# Patient Record
Sex: Female | Born: 1965 | Race: White | Hispanic: No | Marital: Single | Smoking: Never smoker
Health system: Southern US, Community
[De-identification: ages and names within clinical notes are randomized; demographics above are authoritative.]

## PROBLEM LIST (undated history)

## (undated) DIAGNOSIS — D649 Anemia, unspecified: Secondary | ICD-10-CM

## (undated) DIAGNOSIS — F410 Panic disorder [episodic paroxysmal anxiety] without agoraphobia: Secondary | ICD-10-CM

## (undated) DIAGNOSIS — Z87898 Personal history of other specified conditions: Secondary | ICD-10-CM

## (undated) DIAGNOSIS — D259 Leiomyoma of uterus, unspecified: Secondary | ICD-10-CM

## (undated) DIAGNOSIS — I10 Essential (primary) hypertension: Secondary | ICD-10-CM

## (undated) DIAGNOSIS — E119 Type 2 diabetes mellitus without complications: Principal | ICD-10-CM

## (undated) HISTORY — DX: Essential (primary) hypertension: I10

## (undated) HISTORY — DX: Morbid (severe) obesity due to excess calories: E66.01

## (undated) HISTORY — DX: Type 2 diabetes mellitus without complications: E11.9

---

## 1988-05-04 HISTORY — PX: FOOT SURGERY: SHX648

## 1995-05-05 HISTORY — PX: GASTRIC BYPASS: SHX52

## 1996-05-04 HISTORY — PX: CHOLECYSTECTOMY: SHX55

## 2000-12-02 ENCOUNTER — Other Ambulatory Visit: Admission: RE | Admit: 2000-12-02 | Discharge: 2000-12-02 | Payer: Self-pay | Admitting: *Deleted

## 2014-01-12 ENCOUNTER — Ambulatory Visit (INDEPENDENT_AMBULATORY_CARE_PROVIDER_SITE_OTHER): Payer: No Typology Code available for payment source | Admitting: Family

## 2014-01-12 ENCOUNTER — Encounter: Payer: Self-pay | Admitting: Family

## 2014-01-12 VITALS — BP 160/86 | HR 85 | Temp 97.8°F | Resp 18 | Ht 65.75 in | Wt 343.4 lb

## 2014-01-12 DIAGNOSIS — I10 Essential (primary) hypertension: Secondary | ICD-10-CM

## 2014-01-12 DIAGNOSIS — R635 Abnormal weight gain: Secondary | ICD-10-CM

## 2014-01-12 LAB — BASIC METABOLIC PANEL
BUN: 11 mg/dL (ref 6–23)
CALCIUM: 8.9 mg/dL (ref 8.4–10.5)
CO2: 27 meq/L (ref 19–32)
CREATININE: 0.7 mg/dL (ref 0.4–1.2)
Chloride: 103 mEq/L (ref 96–112)
GFR: 98.06 mL/min (ref >60.00)
Glucose, Bld: 149 mg/dL — ABNORMAL HIGH (ref 70–99)
Potassium: 4.2 mEq/L (ref 3.5–5.1)
Sodium: 137 mEq/L (ref 135–145)

## 2014-01-12 LAB — TSH: TSH: 0.84 u[IU]/mL (ref 0.35–4.50)

## 2014-01-12 MED ORDER — METOPROLOL SUCCINATE ER 50 MG PO TB24: 50.0000 mg | ORAL_TABLET | Freq: Every day | ORAL | Status: DC

## 2014-01-12 NOTE — Assessment & Plan Note (Signed)
Likely worsened by weight gain. Will start toprol xl.

## 2014-01-12 NOTE — Progress Notes (Signed)
Pre visit review using our clinic review tool, if applicable. No additional management support is needed unless otherwise documented below in the visit note. 

## 2014-01-12 NOTE — Progress Notes (Signed)
Subjective:    Patient ID: Marissa Barrera, female    DOB: January 23, 1966, 48 y.o.   MRN: [id number]  HPI  Marissa Barrera is a 47 yr old female who presents today to establish care.   HTN- Pt reports that she was diagnosed with HTN around 08-03-05.  She was on medication at that time. She lost weight around 2006/08/04 down to 270's.  She is at her lifetime max weight is her current weight.  Morbid Obesity- reports in the past she has lost weight with calorie counting. She has stopped sugar and bread, processed food, refined carbs. She will eat oatmeal.  Had gastric bypass 08/03/2005, was 300 pounds and got down to 188.  Fried died in 08-03-2005, she took her friends daughter in.  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    Wt Readings from Last 3 Encounters:  01/12/14 343 lb 6.4 oz (155.765 kg)   Body mass index is 55.85 kg/(m^2).  Review of Systems  Constitutional: Positive for unexpected weight change.  HENT: Negative for hearing loss and rhinorrhea.   Eyes: Negative for visual disturbance.  Respiratory: Negative for cough and shortness of breath.   Cardiovascular: Negative for chest pain.       Occasional LE swelling with prolonged sitting  Gastrointestinal: Negative for nausea, vomiting, diarrhea and constipation.  Genitourinary: Negative for dysuria, frequency and menstrual problem.  Musculoskeletal: Negative for arthralgias and myalgias.  Skin: Negative for rash.  Neurological: Negative for headaches.  Hematological: Negative for adenopathy.  Psychiatric/Behavioral:       Reports occasional anxiety- this occurs rarely with stress       Past Medical History  Diagnosis Date  . Hypertension   . Morbid obesity     History   Social History  . Marital Status: Single    Spouse Name: N/A    Number of Children: N/A  . Years of Education: N/A   Occupational History  . Not on file.   Social History Main Topics  . Smoking status: Never Smoker   . Smokeless tobacco:  Never Used  . Alcohol Use: 1.5 oz/week    3 drink(s) per week  . Drug Use: Not on file  . Sexual Activity: Not on file   Other Topics Concern  . 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 Foot surgery Bilateral 1990    pinched nerve  . Gastric bypass  1997  . Cholecystectomy  1998    Family History  Problem Relation Age of Onset  . Arthritis Mother   . Hypertension Father   . Diabetes Father   . Parkinson's disease Father     Allergies  Allergen Reactions  . Penicillins Hives    No current outpatient prescriptions on file prior to visit.   No current facility-administered medications on file prior to visit.    BP 160/86  Pulse 85  Temp(Src) 97.8 F (36.6 C) (Oral)  Resp 18  Ht 5' 5.75" (1.67 m)  Wt 343 lb 6.4 oz (155.765 kg)  BMI 55.85 kg/m2  SpO2 99%  LMP 12/12/2013    Objective:   Physical Exam  Constitutional: She is oriented to person, place, and time. She appears well-developed and well-nourished. No distress.  HENT:  Head: Normocephalic and atraumatic.  Right Ear: Tympanic membrane and ear canal normal.  Left Ear: Tympanic membrane and ear canal normal.  Mouth/Throat: No oropharyngeal exudate, posterior oropharyngeal edema or posterior oropharyngeal erythema.  Neck: No thyromegaly present.  Cardiovascular: Normal rate and regular rhythm.   No murmur heard. Pulmonary/Chest: Effort normal and breath sounds normal. No respiratory distress. She has no wheezes. She has no rales. She exhibits no tenderness.  Musculoskeletal: She exhibits no edema.  Lymphadenopathy:    She has no cervical adenopathy.  Neurological: She is alert and oriented to person,  place, and time.  Psychiatric: She has a normal mood and affect. Her behavior is normal. Judgment and thought content normal.          Assessment & Plan:  25 minutes spent with pt today.  >50% of this time was spent counseling pt on diet, exercise, weight loss and HTN.

## 2014-01-12 NOTE — Assessment & Plan Note (Signed)
We discussed counting calories, continuing dog walking, but adding 5 additional 30 minute work outs a week of another exercise such as biking/swimming/elliptical and getting some light weight training as well. We discussed referral back to a bariatric surgeon and she declines at this time and wants to do it "on my own."

## 2014-01-12 NOTE — Patient Instructions (Addendum)
Please complete lab work prior to leaving. Start toprol xl.  Follow up in 1 month. Continue to work on [employer], exercise, weight loss. Welcome to [facility]!

## 2014-01-14 ENCOUNTER — Encounter: Payer: Self-pay | Admitting: Family

## 2014-01-14 DIAGNOSIS — R739 Hyperglycemia, unspecified: Secondary | ICD-10-CM

## 2014-01-14 HISTORY — DX: Hyperglycemia, unspecified: R73.9

## 2014-03-08 ENCOUNTER — Telehealth: Payer: Self-pay | Admitting: Family

## 2014-03-08 NOTE — Telephone Encounter (Signed)
Caller name: Callyn Relation to pt: self Call back number: [phone] Pharmacy: [facility] on [address]  Reason for call:   Patient states that her ins will not become effective until January and would like to know if Marissa Barrera would prescribe her enough blood pressure medicine(does not know name) to last her until January.

## 2014-03-08 NOTE — Telephone Encounter (Signed)
Ok to fill 

## 2014-03-08 NOTE — Telephone Encounter (Signed)
We need to repeat her BP in the office since it was so high when I saw her. She can book a nurse visit which will be cheaper for her.

## 2014-03-09 NOTE — Telephone Encounter (Signed)
Notified pt and she voices understanding. Scheduled nurse visit for 03/13/14 at 10:15am. Pt only has 6 days left of current Rx and will wait until nurse visit to see if further adjustments need to be made with medication before we send more refills.

## 2014-03-13 ENCOUNTER — Ambulatory Visit (INDEPENDENT_AMBULATORY_CARE_PROVIDER_SITE_OTHER): Payer: No Typology Code available for payment source | Admitting: *Deleted

## 2014-03-13 ENCOUNTER — Other Ambulatory Visit: Payer: Self-pay | Admitting: Family

## 2014-03-13 VITALS — BP 150/86 | HR 75

## 2014-03-13 DIAGNOSIS — I10 Essential (primary) hypertension: Secondary | ICD-10-CM

## 2014-03-13 MED ORDER — METOPROLOL SUCCINATE ER 100 MG PO TB24: 100.0000 mg | ORAL_TABLET | Freq: Every day | ORAL | Status: DC

## 2014-03-13 NOTE — Patient Instructions (Signed)
Increase in pts metoprolol rx sent .Pt to come back 2wks for bp check per @ [facility].

## 2014-03-13 NOTE — Progress Notes (Signed)
BP is improved but still above goal at today's nurse visit. Increase toprol xl from 50mg  to 100mg . Follow up in 2 weeks.

## 2014-04-11 ENCOUNTER — Ambulatory Visit (INDEPENDENT_AMBULATORY_CARE_PROVIDER_SITE_OTHER): Payer: No Typology Code available for payment source

## 2014-04-11 VITALS — BP 138/84

## 2014-04-11 DIAGNOSIS — I10 Essential (primary) hypertension: Secondary | ICD-10-CM

## 2014-04-11 MED ORDER — METOPROLOL SUCCINATE ER 100 MG PO TB24: 100.0000 mg | ORAL_TABLET | Freq: Every day | ORAL | Status: DC

## 2014-04-11 NOTE — Patient Instructions (Signed)
Continue taking same dose. We will call you if there are any changes made.

## 2014-04-11 NOTE — Progress Notes (Signed)
Pre visit review using our clinic review tool, if applicable. No additional management support is needed unless otherwise documented below in the visit note.  Patient tolerated BP check well.  Needs refill on BP med. Sent to pharmacy.

## 2014-05-10 ENCOUNTER — Other Ambulatory Visit: Payer: Self-pay | Admitting: Family

## 2014-05-10 ENCOUNTER — Telehealth: Payer: Self-pay

## 2014-05-10 NOTE — Telephone Encounter (Signed)
Last filled:  04/11/14 Amt: 30, 0 refills Last OV:  01/12/14  Med filled x 30 days.

## 2014-05-10 NOTE — Telephone Encounter (Signed)
Opened in error

## 2014-06-07 ENCOUNTER — Other Ambulatory Visit: Payer: Self-pay | Admitting: Family

## 2014-06-07 NOTE — Telephone Encounter (Signed)
Pt was unable to talk at the time of call.  Stated she had dogs that were lose and she will need to call back.

## 2014-06-08 NOTE — Telephone Encounter (Signed)
30 day supply sent to pharmacy. Pt has appt on 06/12/14 with Inda Castle, NP.

## 2014-06-12 ENCOUNTER — Ambulatory Visit: Payer: No Typology Code available for payment source | Admitting: Family

## 2014-06-19 ENCOUNTER — Ambulatory Visit: Payer: No Typology Code available for payment source | Admitting: Family

## 2014-06-26 ENCOUNTER — Ambulatory Visit (INDEPENDENT_AMBULATORY_CARE_PROVIDER_SITE_OTHER): Payer: 59 | Admitting: Family

## 2014-06-26 ENCOUNTER — Encounter: Payer: Self-pay | Admitting: Family

## 2014-06-26 VITALS — BP 128/72 | HR 73 | Temp 97.7°F | Ht 66.0 in | Wt 323.5 lb

## 2014-06-26 DIAGNOSIS — R739 Hyperglycemia, unspecified: Secondary | ICD-10-CM

## 2014-06-26 DIAGNOSIS — D6489 Other specified anemias: Secondary | ICD-10-CM

## 2014-06-26 DIAGNOSIS — E119 Type 2 diabetes mellitus without complications: Secondary | ICD-10-CM

## 2014-06-26 DIAGNOSIS — R002 Palpitations: Secondary | ICD-10-CM

## 2014-06-26 DIAGNOSIS — I10 Essential (primary) hypertension: Secondary | ICD-10-CM

## 2014-06-26 LAB — CBC WITH DIFFERENTIAL/PLATELET
BASOS ABS: 0 10*3/uL (ref 0.0–0.1)
Basophils Relative: 0.5 % (ref 0.0–3.0)
EOS ABS: 0.1 10*3/uL (ref 0.0–0.7)
Eosinophils Relative: 1.2 % (ref 0.0–5.0)
HCT: 33 % — ABNORMAL LOW (ref 36.0–46.0)
Hemoglobin: 10.8 g/dL — ABNORMAL LOW (ref 12.0–15.0)
LYMPHS ABS: 2.2 10*3/uL (ref 0.7–4.0)
Lymphocytes Relative: 26.1 % (ref 12.0–46.0)
MCHC: 32.5 g/dL (ref 30.0–36.0)
MCV: 70.8 fl — ABNORMAL LOW (ref 78.0–100.0)
MONOS PCT: 5.1 % (ref 3.0–12.0)
Monocytes Absolute: 0.4 10*3/uL (ref 0.1–1.0)
Neutro Abs: 5.6 10*3/uL (ref 1.4–7.7)
Neutrophils Relative %: 67.1 % (ref 43.0–77.0)
PLATELETS: 351 10*3/uL (ref 150.0–400.0)
RBC: 4.67 Mil/uL (ref 3.87–5.11)
RDW: 17.4 % — AB (ref 11.5–15.5)
WBC: 8.4 10*3/uL (ref 4.0–10.5)

## 2014-06-26 LAB — BASIC METABOLIC PANEL
BUN: 9 mg/dL (ref 6–23)
CALCIUM: 8.8 mg/dL (ref 8.4–10.5)
CHLORIDE: 103 meq/L (ref 96–112)
CO2: 27 mEq/L (ref 19–32)
CREATININE: 0.77 mg/dL (ref 0.40–1.20)
GFR: 84.8 mL/min (ref >60.00)
Glucose, Bld: 123 mg/dL — ABNORMAL HIGH (ref 70–99)
Potassium: 4.5 mEq/L (ref 3.5–5.1)
Sodium: 135 mEq/L (ref 135–145)

## 2014-06-26 LAB — TSH: TSH: 1.53 u[IU]/mL (ref 0.35–4.50)

## 2014-06-26 LAB — HEMOGLOBIN A1C: Hgb A1c MFr Bld: 7 % — ABNORMAL HIGH (ref 4.6–6.5)

## 2014-06-26 NOTE — Progress Notes (Signed)
Subjective:    Patient ID: Marissa Barrera, female    DOB: July 15, 1965, 49 y.o.   MRN: [id number]  HPI  Patient is currently maintained on the following medications for blood pressure: metoprolol Patient reports good compliance with blood pressure medications. Patient denies chest pain, shortness of breath, Headaches, blurred vision. Occasional LE edema with prolonged sitting which resolves with ambulation. Has had very mild associated dizziness Last 3 blood pressure readings in our office are as follows: BP Readings from Last 3 Encounters:  06/26/14 128/72  04/11/14 138/84  03/13/14 150/86   Morbid Obesity-  Wt Readings from Last 3 Encounters:  06/26/14 323 lb 8 oz (146.739 kg)  01/12/14 343 lb 6.4 oz (155.765 kg)   Notes occasional palpitations- she reports that she had an episode of palpitations last week which lasted about 10 minutes.  Usually when she has these episodes they are brief and controlled by breathing.   Review of Systems Heavy periods.    Past Medical History  Diagnosis Date  . Hypertension   . Morbid obesity     History   Social History  . Marital Status: Single    Spouse Name: N/A  . Number of Children: N/A  . Years of Education: N/A   Occupational History  . Not on file.   Social History Main Topics  . Smoking status: Never Smoker   . Smokeless tobacco: Never Used  . Alcohol Use: 1.5 oz/week    3 drink(s) per week  . Drug Use: Not on file  . Sexual Activity: Not on file   Other Topics Concern  . 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 Foot surgery Bilateral 1990    pinched nerve  . Gastric bypass  1997  . Cholecystectomy  1998     Family History  Problem Relation Age of Onset  . Arthritis Mother   . Hypertension Father   . Diabetes Father   . Parkinson's disease Father     Allergies  Allergen Reactions  . Penicillins Hives    Current Outpatient Prescriptions on File Prior to Visit  Medication Sig Dispense Refill  . metoprolol succinate (TOPROL-XL) 100 MG 24 hr tablet TAKE 1 TABLET (100 MG TOTAL) BY MOUTH DAILY. NEED OFFICE VISIT NOW 30 tablet 0   No current facility-administered medications on file prior to visit.    BP 128/72 mmHg  Pulse 73  Temp(Src) 97.7 F (36.5 C) (Oral)  Ht 5\' 6"  (1.676 m)  Wt 323 lb 8 oz (146.739 kg)  BMI 52.24 kg/m2  SpO2 98%  LMP 05/28/2014 (Approximate)       Objective:   Physical Exam  Constitutional: She is oriented to person, place, and time. She appears well-developed and well-nourished.  HENT:  Head: Normocephalic and atraumatic.  Cardiovascular: Normal rate, regular rhythm and normal heart sounds.   No murmur heard. Pulmonary/Chest: Effort normal and breath sounds normal. No respiratory distress. She has no wheezes.  Musculoskeletal: She exhibits no edema.  Neurological: She is alert and oriented to person, place, and time.  Skin: Skin is warm and dry.  Psychiatric: She  has a normal mood and affect. Her behavior is normal. Judgment and thought content normal.          Assessment & Plan:

## 2014-06-26 NOTE — Patient Instructions (Signed)
Please complete lab work prior to leaving. Call if palpitations become more frequent. Go to ER if you develop chest pain or palpitations are severe.

## 2014-06-26 NOTE — Progress Notes (Signed)
Pre visit review using our clinic review tool, if applicable. No additional management support is needed unless otherwise documented below in the visit note. 

## 2014-06-27 ENCOUNTER — Encounter: Payer: Self-pay | Admitting: Family

## 2014-06-27 ENCOUNTER — Telehealth: Payer: Self-pay | Admitting: Family

## 2014-06-27 DIAGNOSIS — D649 Anemia, unspecified: Secondary | ICD-10-CM

## 2014-06-27 DIAGNOSIS — R002 Palpitations: Secondary | ICD-10-CM | POA: Insufficient documentation

## 2014-06-27 DIAGNOSIS — E119 Type 2 diabetes mellitus without complications: Secondary | ICD-10-CM

## 2014-06-27 HISTORY — DX: Type 2 diabetes mellitus without complications: E11.9

## 2014-06-27 NOTE — Assessment & Plan Note (Addendum)
Occasional, perhaps worsened by anemia. TSH normal. Will need baseline EKG when she returns for nurse visit. Continue beta blocker.

## 2014-06-27 NOTE — Assessment & Plan Note (Signed)
New. See phone note.  Lab Results  Component Value Date   HGBA1C 7.0* 06/26/2014

## 2014-06-27 NOTE — Assessment & Plan Note (Signed)
Microcytic, suspect iron deficiency, will ask lab to add on serum iron. Add iron 325mg  bid. Obtain IFOB (see phone note)

## 2014-06-27 NOTE — Telephone Encounter (Signed)
Please contact pt and let her know that her blood work shows anemia. Please add iron 325mg  PO bid Ask lab to add on serum iron (dx anemia) Lab work also shows that she is diabetic. She needs to continue to work hard on healthy diet, avoid concentrated sweets, limit carbs, continue weight loss efforts/exercise. Please bring pt in for nurse visit.   She needs: IFOB dx (anemia) EKG (dx palpitations) Meter and CBG training please. 3 month follow up in our office.

## 2014-06-27 NOTE — Assessment & Plan Note (Signed)
Commended pt on her weight loss. Advised her to keep up weight loss efforts.

## 2014-06-27 NOTE — Assessment & Plan Note (Signed)
BP stable, continue current meds.  Consider ace next visit.  Due to new dx of DM2.

## 2014-06-28 NOTE — Telephone Encounter (Signed)
Melissa-- how often should pt check blood sugar?  Notified pt and she voices understanding. Nurse visit scheduled for 07/06/14 at 9:15am, 3 month f/u scheduled for 09/26/14 at 8:45am.

## 2014-06-28 NOTE — Telephone Encounter (Signed)
Once daily, vary times of day and record to bring to next visit.

## 2014-06-28 NOTE — Telephone Encounter (Signed)
Noted  

## 2014-06-28 NOTE — Telephone Encounter (Signed)
Ashlee-- wanted to give you a heads up regarding upcoming nurse visit (07/06/14) for the orders below.

## 2014-06-29 ENCOUNTER — Other Ambulatory Visit (INDEPENDENT_AMBULATORY_CARE_PROVIDER_SITE_OTHER): Payer: 59

## 2014-06-29 DIAGNOSIS — D649 Anemia, unspecified: Secondary | ICD-10-CM

## 2014-06-29 LAB — IRON: IRON: 44 ug/dL (ref 42–145)

## 2014-07-06 ENCOUNTER — Ambulatory Visit: Payer: 59

## 2014-07-12 ENCOUNTER — Other Ambulatory Visit: Payer: Self-pay | Admitting: Family

## 2014-08-09 ENCOUNTER — Other Ambulatory Visit: Payer: Self-pay | Admitting: Family

## 2014-09-26 ENCOUNTER — Ambulatory Visit: Payer: 59 | Admitting: Family

## 2014-10-15 ENCOUNTER — Other Ambulatory Visit: Payer: Self-pay | Admitting: Family

## 2014-10-15 NOTE — Telephone Encounter (Signed)
Metoprolol Rx sent for 30 day supply. Pt was due for follow up on 09/26/14 and is past due.  Please call pt to arrange before further refills are due.  Thank You!

## 2014-10-15 NOTE — Telephone Encounter (Signed)
Left detailed message informing patient of this. °

## 2014-11-14 ENCOUNTER — Other Ambulatory Visit: Payer: Self-pay | Admitting: Family

## 2014-11-14 NOTE — Telephone Encounter (Signed)
Pt was scheduled for follow up 09/26/14 and did not keep appt. 30 day supply already sent to pharmacy with note that appt was needed for further refills.  14 day supply sent to pharmacy. Please call pt to arrange follow up now.

## 2014-11-14 NOTE — Telephone Encounter (Signed)
Scheduled appt with pt for 12/03/14 5:00pm.

## 2014-12-02 ENCOUNTER — Other Ambulatory Visit: Payer: Self-pay | Admitting: Family

## 2014-12-03 ENCOUNTER — Ambulatory Visit: Payer: 59 | Admitting: Family

## 2014-12-03 NOTE — Telephone Encounter (Signed)
Metoprolol denial sent to pharmacy as 14 day supply was given on 11/15/14 and pt was advised that she was past due for follow up. Pt was scheduled for office visit today and cancelled with notation that she has changed Provider.

## 2016-02-10 LAB — HM MAMMOGRAPHY

## 2016-05-25 ENCOUNTER — Other Ambulatory Visit: Payer: Self-pay | Admitting: Adult Health

## 2016-05-25 MED ORDER — METOPROLOL SUCCINATE ER 100 MG PO TB24: 100.0000 mg | ORAL_TABLET | Freq: Every day | ORAL | 0 refills | Status: DC

## 2016-05-25 NOTE — Progress Notes (Signed)
Pt. Called for med refill (only had two tablets left ).  Will establish as new pt. ASAP.  Was previously seen at [place] onsite clinic.

## 2016-05-27 ENCOUNTER — Ambulatory Visit: Payer: Self-pay | Admitting: Adult Health

## 2016-06-03 ENCOUNTER — Ambulatory Visit: Payer: Self-pay | Admitting: Adult Health

## 2016-06-22 ENCOUNTER — Other Ambulatory Visit: Payer: Self-pay | Admitting: Adult Health

## 2016-06-25 ENCOUNTER — Ambulatory Visit (INDEPENDENT_AMBULATORY_CARE_PROVIDER_SITE_OTHER): Payer: BLUE CROSS/BLUE SHIELD | Admitting: Adult Health

## 2016-06-25 ENCOUNTER — Encounter: Payer: Self-pay | Admitting: Adult Health

## 2016-06-25 VITALS — BP 124/82 | HR 80 | Ht 66.0 in | Wt 350.0 lb

## 2016-06-25 DIAGNOSIS — I10 Essential (primary) hypertension: Secondary | ICD-10-CM | POA: Diagnosis not present

## 2016-06-25 DIAGNOSIS — D538 Other specified nutritional anemias: Secondary | ICD-10-CM

## 2016-06-25 DIAGNOSIS — E119 Type 2 diabetes mellitus without complications: Secondary | ICD-10-CM | POA: Diagnosis not present

## 2016-06-25 LAB — POCT GLYCOSYLATED HEMOGLOBIN (HGB A1C): HEMOGLOBIN A1C: 7

## 2016-06-25 LAB — POCT UA - MICROALBUMIN
Creatinine, POC: 200 mg/dL
Microalbumin Ur, POC: 10 mg/L

## 2016-06-25 MED ORDER — METFORMIN HCL 500 MG PO TABS: 500.0000 mg | ORAL_TABLET | Freq: Every day | ORAL | 3 refills | Status: DC

## 2016-06-25 NOTE — Assessment & Plan Note (Signed)
Follow ADA diet and resume swim routine. Addition of Metformin may help aid in weight loss.

## 2016-06-25 NOTE — Progress Notes (Signed)
Subjective:    Patient ID: Marissa Barrera, female    DOB: Feb 23, 1966, 51 y.o.   MRN: [id number]  HPI:  [name] presents to establish as new pt. She is a very pleasant 51 year old woman, with medical hx of morbid obesity, T2D, HTN, and anemia.  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  She has a strong support system of family/friends and denies acute depression today.  She has gained about 15lbs over the last 18 months and plans on re-starting am swimming at the [employer].   She states that her last PCP said her A1c was 7 and called it pre-diabetes and did not provide lifestyle modifications or start her on Metformin.   Patient Care Team    Relationship Specialty Notifications Start End  Odella Aquas, NP PCP - General Family Medicine  06/25/16     Patient Active Problem List   Diagnosis Date Noted  . Diabetes type 2, controlled ([place]) 06/27/2014  . Anemia 06/27/2014  . Palpitations 06/27/2014  . Hyperglycemia 01/14/2014  . Morbid obesity ([place]) 01/12/2014  . HTN (hypertension) 01/12/2014     Past Medical History:  Diagnosis Date  . Diabetes type 2, controlled ([place]) 06/27/2014  . Hypertension   . Morbid obesity ([place])      Past Surgical History:  Procedure Laterality Date  . CHOLECYSTECTOMY  1998  . FOOT SURGERY Bilateral 1990   pinched nerve  . GASTRIC BYPASS  1997     Family History  Problem Relation Age of Onset  . Arthritis Mother   . Hypertension Father   . Diabetes Father   . Parkinson's disease Father   . Cancer Maternal Aunt     breast, ovarian, colon  . Cancer Maternal Grandmother     ovarian  . Heart attack Maternal Grandfather   . Parkinson's disease Paternal Grandmother   . Heart attack Paternal Grandfather      History  Drug Use No     History  Alcohol Use  . 1.5 oz/week  . 3 Standard drinks or equivalent per week    Comment: varied     History  Smoking Status  . Never Smoker   Smokeless Tobacco  . Never Used     Outpatient Encounter Prescriptions as of 06/25/2016  Medication Sig  . losartan-hydrochlorothiazide (HYZAAR) 50-12.5 MG tablet Take 1 tablet by mouth daily.  . metoprolol succinate (TOPROL-XL) 100 MG 24 hr tablet Take 1 tablet (100 mg total) by mouth daily. Take with or immediately following a meal.  . metFORMIN (GLUCOPHAGE) 500 MG tablet Take 1 tablet (500 mg total) by mouth daily after supper.  . [DISCONTINUED] ferrous sulfate 325 (65 FE) MG tablet Take 325 mg by mouth 2 (two) times daily with a meal.   No facility-administered encounter medications on file as of 06/25/2016.     Allergies: Ace inhibitors and Penicillins  Body mass index is 56.49 kg/m.  Blood pressure 124/82, pulse 80, height 5\' 6"  (1.676 m), weight (!) 350 lb (158.8 kg), last menstrual period 06/08/2016.     Review of Systems  Constitutional: Negative for activity change, appetite change, chills, diaphoresis, fatigue, fever and unexpected weight change.  HENT: Negative for congestion.   Eyes: Negative for visual disturbance.  Respiratory: Negative for cough and shortness of breath.   Cardiovascular: Negative for chest pain, palpitations and leg swelling.  Gastrointestinal: Negative for abdominal distention, abdominal pain, blood in stool, constipation, diarrhea,  nausea and vomiting.  Endocrine: Negative for cold intolerance, heat intolerance, polydipsia, polyphagia and polyuria.  Genitourinary: Negative for difficulty urinating and flank pain.  Musculoskeletal: Negative for arthralgias, back pain, gait problem, joint swelling and myalgias.  Neurological: Negative for dizziness, tremors, weakness, light-headedness and headaches.  Psychiatric/Behavioral: Negative for agitation, behavioral problems, confusion, decreased concentration, hallucinations, self-injury, sleep disturbance and suicidal ideas. The patient is not nervous/anxious and is not hyperactive.         Objective:   Physical Exam  Constitutional: She is oriented to person, place, and time. She appears well-developed and well-nourished. No distress.  HENT:  Head: Normocephalic and atraumatic.  Right Ear: External ear normal.  Left Ear: External ear normal.  Eyes: Conjunctivae and EOM are normal. Pupils are equal, round, and reactive to light.  Neck: Normal range of motion. Neck supple.  Cardiovascular: Normal rate, regular rhythm, normal heart sounds and intact distal pulses.   Pulmonary/Chest: Effort normal and breath sounds normal. No respiratory distress. She has no wheezes. She has no rales. She exhibits no tenderness.  Abdominal: Soft. Bowel sounds are normal. She exhibits no distension and no mass. There is no tenderness. There is no rebound and no guarding.  Protuberant abdomin.  Musculoskeletal: Normal range of motion.  Lymphadenopathy:    She has no cervical adenopathy.  Neurological: She is alert and oriented to person, place, and time. She has normal reflexes.  Skin: Skin is warm and dry. No rash noted. She is not diaphoretic. No erythema. No pallor.  Psychiatric: She has a normal mood and affect. Her behavior is normal. Judgment and thought content normal.  Nursing note and vitals reviewed.         Assessment & Plan:   1. Diabetes mellitus without complication ([place])   2. Essential hypertension   3. Controlled type 2 diabetes mellitus without complication, without long-term current use of insulin ([place])   4. Morbid obesity ([place])   5. Other specified nutritional anemias     HTN (hypertension) Take Losartan/HCTZ in am. Continue daily Metoprolol. Increase regular movement (i.e. Morning swimming at [facility]). Reduce sodium intake.  Diabetes type 2, controlled Last A1c was 7 06/2014, however PCP at time never explained that she was diabetic. Today's A1c is 7-discussed in detail ADA guidelines and that she in fact diabetic. Started on Metformin 500mg  nightly with  dinner. Diabetes testing, eating, and exercise information provided. Once insurance guidelines have been verified, will send in testing supplies. Please return in 90 days for A1c re-check and regular f/u.  Morbid obesity Follow ADA diet and resume swim routine. Addition of Metformin may help aid in weight loss.    FOLLOW-UP:  Return in about 3 months (around 09/22/2016).

## 2016-06-25 NOTE — Patient Instructions (Addendum)
Carbohydrate Counting for Diabetes Mellitus, Adult Carbohydrate counting is a method for keeping track of how many carbohydrates you eat. Eating carbohydrates naturally increases the amount of sugar (glucose) in the blood. Counting how many carbohydrates you eat helps keep your blood glucose within normal limits, which helps you manage your diabetes (diabetes mellitus). It is important to know how many carbohydrates you can safely have in each meal. This is different for every person. A diet and nutrition specialist (registered dietitian) can help you make a meal plan and calculate how many carbohydrates you should have at each meal and snack. Carbohydrates are found in the following foods:  Grains, such as breads and cereals.  Dried beans and soy products.  Starchy vegetables, such as potatoes, peas, and corn.  Fruit and fruit juices.  Milk and yogurt.  Sweets and snack foods, such as cake, cookies, candy, chips, and soft drinks. How do I count carbohydrates? There are two ways to count carbohydrates in food. You can use either of the methods or a combination of both. Reading "Nutrition Facts" on packaged food  The "Nutrition Facts" list is included on the labels of almost all packaged foods and beverages in the [place] It includes:  The serving size.  Information about nutrients in each serving, including the grams (g) of carbohydrate per serving. To use the "Nutrition Facts":  Decide how many servings you will have.  Multiply the number of servings by the number of carbohydrates per serving.  The resulting number is the total amount of carbohydrates that you will be having. Learning standard serving sizes of other foods  When you eat foods containing carbohydrates that are not packaged or do not include "Nutrition Facts" on the label, you need to measure the servings in order to count the amount of carbohydrates:  Measure the foods that you will eat with a food scale or measuring  cup, if needed.  Decide how many standard-size servings you will eat.  Multiply the number of servings by 15. Most carbohydrate-rich foods have about 15 g of carbohydrates per serving.  For example, if you eat 8 oz (170 g) of strawberries, you will have eaten 2 servings and 30 g of carbohydrates (2 servings x 15 g = 30 g).  For foods that have more than one food mixed, such as soups and casseroles, you must count the carbohydrates in each food that is included. The following list contains standard serving sizes of common carbohydrate-rich foods. Each of these servings has about 15 g of carbohydrates:   hamburger bun or  English muffin.   oz (15 mL) syrup.   oz (14 g) jelly.  1 slice of bread.  1 six-inch tortilla.  3 oz (85 g) cooked rice or pasta.  4 oz (113 g) cooked dried beans.  4 oz (113 g) starchy vegetable, such as peas, corn, or potatoes.  4 oz (113 g) hot cereal.  4 oz (113 g) mashed potatoes or  of a large baked potato.  4 oz (113 g) canned or frozen fruit.  4 oz (120 mL) fruit juice.  4-6 crackers.  6 chicken nuggets.  6 oz (170 g) unsweetened dry cereal.  6 oz (170 g) plain fat-free yogurt or yogurt sweetened with artificial sweeteners.  8 oz (240 mL) milk.  8 oz (170 g) fresh fruit or one small piece of fruit.  24 oz (680 g) popped popcorn. Example of carbohydrate counting Sample meal  3 oz (85 g) chicken breast.  6 oz (  170 g) brown rice.  4 oz (113 g) corn.  8 oz (240 mL) milk.  8 oz (170 g) strawberries with sugar-free whipped topping. Carbohydrate calculation 1. Identify the foods that contain carbohydrates:  Rice.  Corn.  Milk.  Strawberries. 2. Calculate how many servings you have of each food:  2 servings rice.  1 serving corn.  1 serving milk.  1 serving strawberries. 3. Multiply each number of servings by 15 g:  2 servings rice x 15 g = 30 g.  1 serving corn x 15 g = 15 g.  1 serving milk x 15 g = 15  g.  1 serving strawberries x 15 g = 15 g. 4. Add together all of the amounts to find the total grams of carbohydrates eaten:  30 g + 15 g + 15 g + 15 g = 75 g of carbohydrates total. This information is not intended to replace advice given to you by your health care provider. Make sure you discuss any questions you have with your health care provider. Document Released: 04/20/2005 Document Revised: 11/08/2015 Document Reviewed: 10/02/2015 Elsevier Interactive Patient Education  2017 [place]. Diabetes Mellitus and Exercise Exercising regularly is important for your overall health, especially when you have diabetes (diabetes mellitus). Exercising is not only about losing weight. It has many health benefits, such as increasing muscle strength and bone density and reducing body fat and stress. This leads to improved fitness, flexibility, and endurance, all of which result in better overall health. Exercise has additional benefits for people with diabetes, including:  Reducing appetite.  Helping to lower and control blood glucose.  Lowering blood pressure.  Helping to control amounts of fatty substances (lipids) in the blood, such as cholesterol and triglycerides.  Helping the body to respond better to insulin (improving insulin sensitivity).  Reducing how much insulin the body needs.  Decreasing the risk for heart disease by:  Lowering cholesterol and triglyceride levels.  Increasing the levels of good cholesterol.  Lowering blood glucose levels. What is my activity plan? Your health care provider or certified diabetes educator can help you make a plan for the type and frequency of exercise (activity plan) that works for you. Make sure that you:  Do at least 150 minutes of moderate-intensity or vigorous-intensity exercise each week. This could be brisk walking, biking, or water aerobics.  Do stretching and strength exercises, such as yoga or weightlifting, at least 2 times a  week.  Spread out your activity over at least 3 days of the week.  Get some form of physical activity every day.  Do not go more than 2 days in a row without some kind of physical activity.  Avoid being inactive for more than 90 minutes at a time. Take frequent breaks to walk or stretch.  Choose a type of exercise or activity that you enjoy, and set realistic goals.  Start slowly, and gradually increase the intensity of your exercise over time. What do I need to know about managing my diabetes?  Check your blood glucose before and after exercising.  If your blood glucose is higher than 240 mg/dL (13.3 mmol/L) before you exercise, check your urine for ketones. If you have ketones in your urine, do not exercise until your blood glucose returns to normal.  Know the symptoms of low blood glucose (hypoglycemia) and how to treat it. Your risk for hypoglycemia increases during and after exercise. Common symptoms of hypoglycemia can include:  Hunger.  Anxiety.  Sweating and  feeling clammy.  Confusion.  Dizziness or feeling light-headed.  Increased heart rate or palpitations.  Blurry vision.  Tingling or numbness around the mouth, lips, or tongue.  Tremors or shakes.  Irritability.  Keep a rapid-acting carbohydrate snack available before, during, and after exercise to help prevent or treat hypoglycemia.  Avoid injecting insulin into areas of the body that are going to be exercised. For example, avoid injecting insulin into:  The arms, when playing tennis.  The legs, when jogging.  Keep records of your exercise habits. Doing this can help you and your health care provider adjust your diabetes management plan as needed. Write down:  Food that you eat before and after you exercise.  Blood glucose levels before and after you exercise.  The type and amount of exercise you have done.  When your insulin is expected to peak, if you use insulin. Avoid exercising at times when  your insulin is peaking.  When you start a new exercise or activity, work with your health care provider to make sure the activity is safe for you, and to adjust your insulin, medicines, or food intake as needed.  Drink plenty of water while you exercise to prevent dehydration or heat stroke. Drink enough fluid to keep your urine clear or pale yellow. This information is not intended to replace advice given to you by your health care provider. Make sure you discuss any questions you have with your health care provider. Document Released: 07/11/2003 Document Revised: 11/08/2015 Document Reviewed: 09/30/2015 Elsevier Interactive Patient Education  2017 [place]. Blood Glucose Monitoring, Adult Monitoring your blood sugar (glucose) helps you manage your diabetes. It also helps you and your health care provider determine how well your diabetes management plan is working. Blood glucose monitoring involves checking your blood glucose as often as directed, and keeping a record (log) of your results over time. Why should I monitor my blood glucose? Checking your blood glucose regularly can:  Help you understand how food, exercise, illnesses, and medicines affect your blood glucose.  Let you know what your blood glucose is at any time. You can quickly tell if you are having low blood glucose (hypoglycemia) or high blood glucose (hyperglycemia).  Help you and your health care provider adjust your medicines as needed. When should I check my blood glucose? Follow instructions from your health care provider about how often to check your blood glucose. This may depend on:  The type of diabetes you have.  How well-controlled your diabetes is.  Medicines you are taking. If you have type 1 diabetes:  Check your blood glucose at least 2 times a day.  Also check your blood glucose:  Before every insulin injection.  Before and after exercise.  Between meals.  2 hours after a  meal.  Occasionally between 2:00 a.m. and 3:00 a.m., as directed.  Before potentially dangerous tasks, like driving or using heavy machinery.  At bedtime.  You may need to check your blood glucose more often, up to 6-10 times a day:  If you use an insulin pump.  If you need multiple daily injections (MDI).  If your diabetes is not well-controlled.  If you are ill.  If you have a history of severe hypoglycemia.  If you have a history of not knowing when your blood glucose is getting low (hypoglycemia unawareness). If you have type 2 diabetes:  If you take insulin or other diabetes medicines, check your blood glucose at least 2 times a day.  If you  are on intensive insulin therapy, check your blood glucose at least 4 times a day. Occasionally, you may also need to check between 2:00 a.m. and 3:00 a.m., as directed.  Also check your blood glucose:  Before and after exercise.  Before potentially dangerous tasks, like driving or using heavy machinery.  You may need to check your blood glucose more often if:  Your medicine is being adjusted.  Your diabetes is not well-controlled.  You are ill. What is a blood glucose log?  A blood glucose log is a record of your blood glucose readings. It helps you and your health care provider:  Look for patterns in your blood glucose over time.  Adjust your diabetes management plan as needed.  Every time you check your blood glucose, write down your result and notes about things that may be affecting your blood glucose, such as your diet and exercise for the day.  Most glucose meters store a record of glucose readings in the meter. Some meters allow you to download your records to a computer. How do I check my blood glucose? Follow these steps to get accurate readings of your blood glucose: Supplies needed   Blood glucose meter.  Test strips for your meter. Each meter has its own strips. You must use the strips that come with your  meter.  A needle to prick your finger (lancet). Do not use lancets more than once.  A device that holds the lancet (lancing device).  A journal or log book to write down your results. Procedure  Wash your hands with soap and water.  Prick the side of your finger (not the tip) with the lancet. Use a different finger each time.  Gently rub the finger until a small drop of blood appears.  Follow instructions that come with your meter for inserting the test strip, applying blood to the strip, and using your blood glucose meter.  Write down your result and any notes. Alternative testing sites  Some meters allow you to use areas of your body other than your finger (alternative sites) to test your blood.  If you think you may have hypoglycemia, or if you have hypoglycemia unawareness, do not use alternative sites. Use your finger instead.  Alternative sites may not be as accurate as the fingers, because blood flow is slower in these areas. This means that the result you get may be delayed, and it may be different from the result that you would get from your finger.  The most common alternative sites are:  Forearm.  Thigh.  Palm of the hand. Additional tips  Always keep your supplies with you.  If you have questions or need help, all blood glucose meters have a 24-hour "hotline" number that you can call. You may also contact your health care provider.  After you use a few boxes of test strips, adjust (calibrate) your blood glucose meter by following instructions that came with your meter. This information is not intended to replace advice given to you by your health care provider. Make sure you discuss any questions you have with your health care provider. Document Released: 04/23/2003 Document Revised: 11/08/2015 Document Reviewed: 09/30/2015 Elsevier Interactive Patient Education  2017 [place] blood pressure medications. Recommend taking Losartan/HCTZ in  am. Please return in 3 months for A1c re-check and regular f/u. Please bring blood sugar log with you to your next appt.

## 2016-06-25 NOTE — Assessment & Plan Note (Signed)
Take Losartan/HCTZ in am. Continue daily Metoprolol. Increase regular movement (i.e. Morning swimming at [facility]). Reduce sodium intake.

## 2016-06-25 NOTE — Assessment & Plan Note (Signed)
Last A1c was 7 06/2014, however PCP at time never explained that she was diabetic. Today's A1c is 7-discussed in detail ADA guidelines and that she in fact diabetic. Started on Metformin 500mg  nightly with dinner. Diabetes testing, eating, and exercise information provided. Once insurance guidelines have been verified, will send in testing supplies. Please return in 90 days for A1c re-check and regular f/u.

## 2016-06-26 ENCOUNTER — Other Ambulatory Visit: Payer: Self-pay

## 2016-06-26 MED ORDER — METOPROLOL SUCCINATE ER 100 MG PO TB24: ORAL_TABLET | ORAL | 0 refills | Status: DC

## 2016-06-26 MED ORDER — LOSARTAN POTASSIUM-HCTZ 50-12.5 MG PO TABS: 1.0000 | ORAL_TABLET | Freq: Every day | ORAL | 0 refills | Status: DC

## 2016-06-29 ENCOUNTER — Telehealth: Payer: Self-pay | Admitting: Adult Health

## 2016-06-29 MED ORDER — METOPROLOL SUCCINATE ER 100 MG PO TB24: ORAL_TABLET | ORAL | 0 refills | Status: DC

## 2016-06-29 NOTE — Telephone Encounter (Signed)
Patient is aware that we sent her metoprolol to [facility] but she was just informed that she will not receive it for 2 weeks and is currently out. She was wondering if she could get a 2 week supply sent to the [facility] [address] to get her through until the mail order one arrives.

## 2016-06-29 NOTE — Addendum Note (Signed)
Addended by: Fonnie Mu on: 06/29/2016 11:24 AM   Modules accepted: Orders

## 2016-06-29 NOTE — Telephone Encounter (Signed)
LVM for pt informing her 2 week RX sent to [facility].  Charyl Bigger, CMA

## 2016-07-01 ENCOUNTER — Telehealth: Payer: Self-pay

## 2016-07-01 NOTE — Telephone Encounter (Signed)
Received fax from [facility] requesting refill on Metformin.  Faxed returned informing them that RX was sent to [facility] on 06/25/16 and they may contact this pharmacy to have RX transferred.  Charyl Bigger, CMA

## 2016-09-01 ENCOUNTER — Other Ambulatory Visit: Payer: Self-pay | Admitting: Adult Health

## 2016-09-02 ENCOUNTER — Other Ambulatory Visit: Payer: Self-pay

## 2016-09-02 MED ORDER — METFORMIN HCL 500 MG PO TABS: 500.0000 mg | ORAL_TABLET | Freq: Every day | ORAL | 3 refills | Status: DC

## 2016-11-12 ENCOUNTER — Other Ambulatory Visit: Payer: Self-pay | Admitting: Adult Health

## 2016-11-25 ENCOUNTER — Other Ambulatory Visit: Payer: Self-pay | Admitting: Adult Health

## 2016-12-22 ENCOUNTER — Other Ambulatory Visit: Payer: Self-pay | Admitting: Adult Health

## 2017-01-06 ENCOUNTER — Ambulatory Visit: Payer: BLUE CROSS/BLUE SHIELD | Admitting: Adult Health

## 2017-01-20 ENCOUNTER — Ambulatory Visit: Payer: BLUE CROSS/BLUE SHIELD | Admitting: Adult Health

## 2017-10-15 ENCOUNTER — Other Ambulatory Visit: Payer: Self-pay | Admitting: Adult Health

## 2017-10-20 ENCOUNTER — Telehealth: Payer: Self-pay | Admitting: General Practice

## 2017-10-20 NOTE — Telephone Encounter (Signed)
Called patient to set up 30 Appt for Rx refill required OV--per patient [facility] is out of network with her Ins Co --she can no longer come her--FYI--glh

## 2017-10-20 NOTE — Telephone Encounter (Signed)
Noted MPulliam, CMA/RT(R)  

## 2017-11-11 DIAGNOSIS — E538 Deficiency of other specified B group vitamins: Secondary | ICD-10-CM | POA: Insufficient documentation

## 2018-02-10 DIAGNOSIS — M79671 Pain in right foot: Secondary | ICD-10-CM | POA: Insufficient documentation

## 2020-02-05 DIAGNOSIS — N950 Postmenopausal bleeding: Secondary | ICD-10-CM | POA: Insufficient documentation

## 2020-02-14 DIAGNOSIS — IMO0001 Reserved for inherently not codable concepts without codable children: Secondary | ICD-10-CM | POA: Insufficient documentation

## 2020-02-14 DIAGNOSIS — Z531 Procedure and treatment not carried out because of patient's decision for reasons of belief and group pressure: Secondary | ICD-10-CM | POA: Insufficient documentation

## 2020-03-04 HISTORY — PX: HYSTEROSCOPY: SHX211

## 2020-06-17 ENCOUNTER — Encounter: Payer: Self-pay | Admitting: Obstetrics & Gynecology

## 2020-06-17 ENCOUNTER — Ambulatory Visit: Payer: 59 | Admitting: Obstetrics & Gynecology

## 2020-06-17 ENCOUNTER — Other Ambulatory Visit: Payer: Self-pay

## 2020-06-17 VITALS — BP 122/77 | HR 75 | Wt 329.0 lb

## 2020-06-17 DIAGNOSIS — N939 Abnormal uterine and vaginal bleeding, unspecified: Secondary | ICD-10-CM | POA: Diagnosis not present

## 2020-06-17 DIAGNOSIS — Z30431 Encounter for routine checking of intrauterine contraceptive device: Secondary | ICD-10-CM | POA: Diagnosis not present

## 2020-06-17 DIAGNOSIS — N950 Postmenopausal bleeding: Secondary | ICD-10-CM | POA: Diagnosis not present

## 2020-06-17 DIAGNOSIS — D250 Submucous leiomyoma of uterus: Secondary | ICD-10-CM | POA: Diagnosis not present

## 2020-06-17 DIAGNOSIS — N840 Polyp of corpus uteri: Secondary | ICD-10-CM

## 2020-06-17 NOTE — Progress Notes (Signed)
History:  55 y.o. G0P0000 here today for eval of PMPB. Pt was being seen at [facility]. She has changed care due to insurance changes. She had a hysteroscopy with D&C and polypectomy 03/15/2020 due to perimenopausal bleeding. The op note revealed that  Polyps were removed. The surg path shows benign pathology.  Pt had a LnIUD placed at the same time. Pt reports that she has continued to bleed since that time. Over the past 2 weeks the bleeding has decreased to spotting and over the last 1-2 days it has finally stopped. This visit would have been her followup with her prev provider but, she was also concerned about the continuous bleeding.   The following portions of the patient's history were reviewed and updated as appropriate: allergies, current medications, past family history, past medical history, past social history, past surgical history and problem list.  Review of Systems:  Pertinent items are noted in HPI.    Objective:  Physical Exam Blood pressure 122/77, pulse 75, weight (!) 329 lb (149.2 kg).  CONSTITUTIONAL: Well-developed, well-nourished female in no acute distress.  HENT:  Normocephalic, atraumatic EYES: Conjunctivae and EOM are normal. No scleral icterus.  NECK: Normal range of motion SKIN: Skin is warm and dry. No rash noted. Not diaphoretic.No pallor. [place]: Alert and oriented to person, place, and time. Normal coordination.  Pelvic: pt declined  Labs and Imaging surg path from [facility]: 03/15/2020 ENDOMETRIUM, CURETTAGE: Fragments of benign endometrial polyp. Few fragments of weakly proliferative endometrium. No malignancy identified.  Assessment & Plan:  Post menopausal bleeding- review of the records is included above. I suspect the bleeding is still related to the endometrial thickness that is not becoming atrophic. The path was neg for malignancy.  Rec 3 months f/u Pt instructed to call if her bleeding returns and is heavy.  Will follow for now.    Total face-to-face time with patient was 30 min.  Greater than 50% was spent in counseling and coordination of care with the patient.   Dawne Casali L. Harraway-Smith, M.D., Cherlynn June

## 2020-07-23 ENCOUNTER — Ambulatory Visit: Payer: 59 | Admitting: Legal Medicine

## 2020-08-29 ENCOUNTER — Encounter: Payer: Self-pay | Admitting: Nurse Practitioner

## 2020-08-29 ENCOUNTER — Ambulatory Visit (INDEPENDENT_AMBULATORY_CARE_PROVIDER_SITE_OTHER): Payer: 59 | Admitting: Nurse Practitioner

## 2020-08-29 ENCOUNTER — Other Ambulatory Visit: Payer: Self-pay

## 2020-08-29 VITALS — BP 146/90 | HR 74 | Temp 97.8°F | Ht 66.0 in | Wt 329.8 lb

## 2020-08-29 DIAGNOSIS — Z13220 Encounter for screening for lipoid disorders: Secondary | ICD-10-CM

## 2020-08-29 DIAGNOSIS — I10 Essential (primary) hypertension: Secondary | ICD-10-CM

## 2020-08-29 DIAGNOSIS — E538 Deficiency of other specified B group vitamins: Secondary | ICD-10-CM

## 2020-08-29 DIAGNOSIS — E1165 Type 2 diabetes mellitus with hyperglycemia: Secondary | ICD-10-CM

## 2020-08-29 DIAGNOSIS — E782 Mixed hyperlipidemia: Secondary | ICD-10-CM | POA: Diagnosis not present

## 2020-08-29 MED ORDER — LOSARTAN POTASSIUM-HCTZ 50-12.5 MG PO TABS: 1.0000 | ORAL_TABLET | Freq: Every day | ORAL | 1 refills | Status: DC

## 2020-08-29 MED ORDER — METOPROLOL SUCCINATE ER 200 MG PO TB24
200.0000 mg | ORAL_TABLET | Freq: Every day | ORAL | 3 refills | Status: DC
Start: 2020-08-29 — End: 2021-03-03

## 2020-08-29 NOTE — Progress Notes (Signed)
Subjective:  Patient ID: Marissa Barrera, female    DOB: 1966-04-17  Age: 55 y.o. MRN: [id number]  CC: Establish Care (New patient/Would like to discuss DM medications, would like to discuss the benefits of ozempic. )  HPI  Controlled type 2 diabetes mellitus without complication, without long-term current use of insulin ([place]) Last HgbA1c of 6.3 per [facility] provider 04/2020 Current use of Ozempic 0.5mg  weekly x approx.38months, in combination with metformin 500mg  daily. Does not check glucose at home No neuropathy, no CKD  Urine microalbumin: normal CMP: normal HgbA1c: 6.6% Elevated LDL: start atorvastatin 20mg . New rx sent Continue metformin and ozempic F/up in 64months   Morbid obesity (Marissa Barrera) Reports 40lbs weight loss in last 1year with use of ozempic. Has difficulty maintaining recommended diet due to increased anxiety/depression. No exercise regimen. She had nutrition consult in past and declines another referral to this time. Due to emotional eating advised about need for counseling, but she declined at this time.  Advised to incorporate daily exercise and maintain low fat/low carb diet. F/up in 42months  Essential hypertension BP is slightly elevated today asymmptomatic Current use of metoprolol and losartan/HCTZ BP Readings from Last 3 Encounters:  08/29/20 (!) 146/90  06/17/20 122/77  06/25/16 124/82   Advise to monitor at home Repeat CMP. Maintain current medication F/up in 66month  Vitamin B12 deficiency Normal repeat B12  Wt Readings from Last 3 Encounters:  08/29/20 (!) 329 lb 12.8 oz (149.6 kg)  06/17/20 (!) 329 lb (149.2 kg)  06/25/16 (!) 350 lb (158.8 kg)   BP Readings from Last 3 Encounters:  08/29/20 (!) 146/90  06/17/20 122/77  06/25/16 124/82   Depression screen PHQ 2/9 08/29/2020  Decreased Interest 1  Down, Depressed, Hopeless 1  PHQ - 2 Score 2  Altered sleeping 2  Tired, decreased energy 1  Change in appetite 2  Feeling bad or failure  about yourself  3  Trouble concentrating 0  Moving slowly or fidgety/restless 0  Suicidal thoughts 0  PHQ-9 Score 10  Difficult doing work/chores Somewhat difficult   GAD 7 : Generalized Anxiety Score 08/29/2020  Nervous, Anxious, on Edge 1  Control/stop worrying 1  Worry too much - different things 1  Trouble relaxing 0  Restless 0  Easily annoyed or irritable 1  Afraid - awful might happen 1  Total GAD 7 Score 5  Anxiety Difficulty Not difficult at all   Reviewed past Medical, Social and Family history today.  Outpatient Medications Prior to Visit  Medication Sig Dispense Refill  . metFORMIN (GLUCOPHAGE) 500 MG tablet Take 1 tablet by mouth daily after supper.  Patient needs office visit for further refills. MPulliam, CMA/RT(R) 30 tablet 0  . metoprolol succinate (TOPROL-XL) 100 MG 24 hr tablet Take with or immediately following a meal. (Patient taking differently: 200 mg. Take with or immediately following a meal.) 15 tablet 0  . losartan-hydrochlorothiazide (HYZAAR) 50-12.5 MG tablet TAKE 1 TABLET BY MOUTH  DAILY (Patient not taking: No sig reported) 30 tablet 0  . metoprolol succinate (TOPROL-XL) 100 MG 24 hr tablet TAKE 1 TABLET BY MOUTH  DAILY WITH OR IMMEDIATELY  FOLLOWING A MEAL. (Patient not taking: No sig reported) 90 tablet 0  . metoprolol succinate (TOPROL-XL) 100 MG 24 hr tablet TAKE 1 TABLET BY MOUTH  DAILY WITH OR IMMEDIATELY  FOLLOWING A MEAL. (Patient not taking: No sig reported) 30 tablet 0  . Semaglutide (OZEMPIC, 0.25 OR 0.5 MG/DOSE, [place]) Inject into the skin. (Patient not taking:  Reported on 08/29/2020)     No facility-administered medications prior to visit.    ROS See HPI  Objective:  BP (!) 146/90 (BP Location: Left Arm, Patient Position: Sitting, Cuff Size: Large)   Pulse 74   Temp 97.8 F (36.6 C) (Temporal)   Ht 5\' 6"  (1.676 m)   Wt (!) 329 lb 12.8 oz (149.6 kg)   SpO2 98%   BMI 53.23 kg/m   Physical Exam  Assessment & Plan:  This visit  occurred during the SARS-CoV-2 public health emergency.  Safety protocols were in place, including screening questions prior to the visit, additional usage of staff PPE, and extensive cleaning of exam room while observing appropriate contact time as indicated for disinfecting solutions.   [name] was seen today for establish care.  Diagnoses and all orders for this visit:  Controlled type 2 diabetes mellitus with hyperglycemia, without long-term current use of insulin (HCC) -     Comprehensive metabolic panel -     Hemoglobin A1c -     Microalbumin / creatinine urine ratio -     Semaglutide,0.25 or 0.5MG /DOS, (OZEMPIC, 0.25 OR 0.5 MG/DOSE,) 2 MG/1.5ML SOPN; Inject 0.5 mg into the skin once a week. -     metFORMIN (GLUCOPHAGE) 500 MG tablet; Take 1 tablet by mouth daily after supper.  Vitamin B12 deficiency -     B12  Mixed hyperlipidemia -     Lipid panel -     atorvastatin (LIPITOR) 20 MG tablet; Take 1 tablet (20 mg total) by mouth daily.  Essential hypertension -     Comprehensive metabolic panel -     metoprolol succinate (TOPROL-XL) 200 MG 24 hr tablet; Take 1 tablet (200 mg total) by mouth daily. Take with or immediately following a meal. -     Discontinue: losartan-hydrochlorothiazide (HYZAAR) 50-12.5 MG tablet; Take 1 tablet by mouth daily. -     losartan-hydrochlorothiazide (HYZAAR) 50-12.5 MG tablet; Take 1 tablet by mouth daily.  Morbid obesity (HCC) -     TSH -     Semaglutide,0.25 or 0.5MG /DOS, (OZEMPIC, 0.25 OR 0.5 MG/DOSE,) 2 MG/1.5ML SOPN; Inject 0.5 mg into the skin once a week.    Problem List Items Addressed This Visit      Cardiovascular and Mediastinum   Essential hypertension    BP is slightly elevated today asymmptomatic Current use of metoprolol and losartan/HCTZ BP Readings from Last 3 Encounters:  08/29/20 (!) 146/90  06/17/20 122/77  06/25/16 124/82   Advise to monitor at home Repeat CMP. Maintain current medication F/up in 14month       Relevant Medications   metoprolol succinate (TOPROL-XL) 200 MG 24 hr tablet   atorvastatin (LIPITOR) 20 MG tablet   losartan-hydrochlorothiazide (HYZAAR) 50-12.5 MG tablet   Other Relevant Orders   Comprehensive metabolic panel (Completed)     Endocrine   Controlled type 2 diabetes mellitus without complication, without long-term current use of insulin (HCC) - Primary    Last HgbA1c of 6.3 per [facility] provider 04/2020 Current use of Ozempic 0.5mg  weekly x approx.73months, in combination with metformin 500mg  daily. Does not check glucose at home No neuropathy, no CKD  Urine microalbumin: normal CMP: normal HgbA1c: 6.6% Elevated LDL: start atorvastatin 20mg . New rx sent Continue metformin and ozempic F/up in 88months       Relevant Medications   atorvastatin (LIPITOR) 20 MG tablet   losartan-hydrochlorothiazide (HYZAAR) 50-12.5 MG tablet   Semaglutide,0.25 or 0.5MG /DOS, (OZEMPIC, 0.25 OR 0.5 MG/DOSE,) 2 MG/1.5ML SOPN  metFORMIN (GLUCOPHAGE) 500 MG tablet     Other   Morbid obesity ([place])    Reports 40lbs weight loss in last 1year with use of ozempic. Has difficulty maintaining recommended diet due to increased anxiety/depression. No exercise regimen. She had nutrition consult in past and declines another referral to this time. Due to emotional eating advised about need for counseling, but she declined at this time.  Advised to incorporate daily exercise and maintain low fat/low carb diet. F/up in 22months      Relevant Medications   Semaglutide,0.25 or 0.5MG /DOS, (OZEMPIC, 0.25 OR 0.5 MG/DOSE,) 2 MG/1.5ML SOPN   metFORMIN (GLUCOPHAGE) 500 MG tablet   Other Relevant Orders   TSH (Completed)   Vitamin B12 deficiency    Normal repeat B12      Relevant Orders   B12 (Completed)    Other Visit Diagnoses    Mixed hyperlipidemia       Relevant Medications   metoprolol succinate (TOPROL-XL) 200 MG 24 hr tablet   atorvastatin (LIPITOR) 20 MG tablet    losartan-hydrochlorothiazide (HYZAAR) 50-12.5 MG tablet   Other Relevant Orders   Lipid panel (Completed)      Follow-up: Return in about 3 months (around 11/28/2020) for DM and HTN, hyperlipidemia (fasting).  Wilfred Lacy, NP

## 2020-08-29 NOTE — Patient Instructions (Signed)
Thank you for choosing [facility]  Go to lab for blood draw Schedule appt for annual eye exam and dental cleaning every 33months. Start daily exercise (walking 14mins daily and weight training) Continue low fat and low carb diet.  You will be contacted with lab results

## 2020-08-30 LAB — LIPID PANEL
Cholesterol: 225 mg/dL — ABNORMAL HIGH (ref 0–200)
HDL: 52.4 mg/dL (ref >39.00)
LDL Cholesterol: 143 mg/dL — ABNORMAL HIGH (ref 0–99)
NonHDL: 172.67
Total CHOL/HDL Ratio: 4
Triglycerides: 146 mg/dL (ref 0.0–149.0)
VLDL: 29.2 mg/dL (ref 0.0–40.0)

## 2020-08-30 LAB — MICROALBUMIN / CREATININE URINE RATIO
Creatinine,U: 80.1 mg/dL
Microalb Creat Ratio: 0.9 mg/g (ref 0.0–30.0)
Microalb, Ur: <0.7 mg/dL (ref 0.0–1.9)

## 2020-08-30 LAB — TSH: TSH: 2.57 u[IU]/mL (ref 0.35–4.50)

## 2020-08-30 LAB — COMPREHENSIVE METABOLIC PANEL
ALT: 15 U/L (ref 0–35)
AST: 14 U/L (ref 0–37)
Albumin: 4.4 g/dL (ref 3.5–5.2)
Alkaline Phosphatase: 75 U/L (ref 39–117)
BUN: 12 mg/dL (ref 6–23)
CO2: 29 mEq/L (ref 19–32)
Calcium: 10.2 mg/dL (ref 8.4–10.5)
Chloride: 100 mEq/L (ref 96–112)
Creatinine, Ser: 0.81 mg/dL (ref 0.40–1.20)
GFR: 82.05 mL/min (ref >60.00)
Glucose, Bld: 125 mg/dL — ABNORMAL HIGH (ref 70–99)
Potassium: 4.9 mEq/L (ref 3.5–5.1)
Sodium: 137 mEq/L (ref 135–145)
Total Bilirubin: 0.5 mg/dL (ref 0.2–1.2)
Total Protein: 7.7 g/dL (ref 6.0–8.3)

## 2020-08-30 LAB — VITAMIN B12: Vitamin B-12: 291 pg/mL (ref 211–911)

## 2020-08-30 LAB — HEMOGLOBIN A1C: Hgb A1c MFr Bld: 6.6 % — ABNORMAL HIGH (ref 4.6–6.5)

## 2020-08-30 MED ORDER — OZEMPIC (0.25 OR 0.5 MG/DOSE) 2 MG/1.5ML ~~LOC~~ SOPN: 0.5000 mg | PEN_INJECTOR | SUBCUTANEOUS | 2 refills | Status: DC

## 2020-08-30 MED ORDER — ATORVASTATIN CALCIUM 20 MG PO TABS: 20.0000 mg | ORAL_TABLET | Freq: Every day | ORAL | 3 refills | Status: DC

## 2020-08-30 MED ORDER — LOSARTAN POTASSIUM-HCTZ 50-12.5 MG PO TABS: 1.0000 | ORAL_TABLET | Freq: Every day | ORAL | 1 refills | Status: DC

## 2020-08-30 MED ORDER — METFORMIN HCL 500 MG PO TABS: ORAL_TABLET | ORAL | 1 refills | Status: DC

## 2020-08-30 NOTE — Assessment & Plan Note (Signed)
BP is slightly elevated today asymmptomatic Current use of metoprolol and losartan/HCTZ BP Readings from Last 3 Encounters:  08/29/20 (!) 146/90  06/17/20 122/77  06/25/16 124/82   Advise to monitor at home Repeat CMP. Maintain current medication F/up in 91month

## 2020-08-30 NOTE — Assessment & Plan Note (Addendum)
Normal repeat B12

## 2020-08-30 NOTE — Assessment & Plan Note (Addendum)
Last HgbA1c of 6.3 per [facility] provider 04/2020 Current use of Ozempic 0.5mg  weekly x approx.47months, in combination with metformin 500mg  daily. Does not check glucose at home No neuropathy, no CKD  Urine microalbumin: normal CMP: normal HgbA1c: 6.6% Elevated LDL: start atorvastatin 20mg . New rx sent Continue metformin and ozempic F/up in 41months

## 2020-08-30 NOTE — Assessment & Plan Note (Signed)
Reports 40lbs weight loss in last 1year with use of ozempic. Has difficulty maintaining recommended diet due to increased anxiety/depression. No exercise regimen. She had nutrition consult in past and declines another referral to this time. Due to emotional eating advised about need for counseling, but she declined at this time.  Advised to incorporate daily exercise and maintain low fat/low carb diet. F/up in 49months

## 2020-09-02 MED ORDER — LOSARTAN POTASSIUM 50 MG PO TABS: 50.0000 mg | ORAL_TABLET | Freq: Every day | ORAL | 1 refills | Status: DC

## 2020-09-19 ENCOUNTER — Telehealth: Payer: 59 | Admitting: Obstetrics & Gynecology

## 2020-11-27 ENCOUNTER — Ambulatory Visit: Payer: 59 | Admitting: Nurse Practitioner

## 2020-12-02 ENCOUNTER — Other Ambulatory Visit: Payer: Self-pay

## 2020-12-02 ENCOUNTER — Encounter: Payer: Self-pay | Admitting: Nurse Practitioner

## 2020-12-02 ENCOUNTER — Ambulatory Visit (INDEPENDENT_AMBULATORY_CARE_PROVIDER_SITE_OTHER): Payer: 59 | Admitting: Nurse Practitioner

## 2020-12-02 VITALS — BP 130/76 | HR 63 | Temp 96.8°F | Wt 330.2 lb

## 2020-12-02 DIAGNOSIS — E1169 Type 2 diabetes mellitus with other specified complication: Secondary | ICD-10-CM

## 2020-12-02 DIAGNOSIS — E782 Mixed hyperlipidemia: Secondary | ICD-10-CM | POA: Diagnosis not present

## 2020-12-02 DIAGNOSIS — I10 Essential (primary) hypertension: Secondary | ICD-10-CM

## 2020-12-02 LAB — POCT GLYCOSYLATED HEMOGLOBIN (HGB A1C): HbA1c POC (<> result, manual entry): 6.8 % (ref 4.0–5.6)

## 2020-12-02 MED ORDER — OZEMPIC (0.25 OR 0.5 MG/DOSE) 2 MG/1.5ML ~~LOC~~ SOPN: 1.0000 mg | PEN_INJECTOR | SUBCUTANEOUS | 3 refills | Status: DC

## 2020-12-02 MED ORDER — ATORVASTATIN CALCIUM 20 MG PO TABS: 20.0000 mg | ORAL_TABLET | ORAL | 2 refills | Status: DC

## 2020-12-02 MED ORDER — ATORVASTATIN CALCIUM 20 MG PO TABS: 20.0000 mg | ORAL_TABLET | Freq: Every day | ORAL | 3 refills | Status: DC

## 2020-12-02 MED ORDER — METFORMIN HCL 500 MG PO TABS: ORAL_TABLET | ORAL | 1 refills | Status: DC

## 2020-12-02 NOTE — Progress Notes (Signed)
Subjective:  Patient ID: Marissa Barrera, female    DOB: 12-Oct-1965  Age: 55 y.o. MRN: [id number]  CC: Follow-up (Med recheck)  HPI  Essential hypertension BP at goal with metoprolol Reports she never took losartan nor HCTZ in past BP Readings from Last 3 Encounters:  12/02/20 130/76  08/29/20 (!) 146/90  06/17/20 122/77   Maintain metoprolol only Refill sent  Controlled type 2 diabetes mellitus without complication, without long-term current use of insulin (HCC) Controlled with hgbA1c at 6.8% today Negative urine microalbumin   Maintain metformin dose Increase ozempic to '1mg'$  weekly F/up in 30month  Morbid obesity (HCC) No weight loss noted today Wt Readings from Last 3 Encounters:  12/02/20 (!) 330 lb 3.2 oz (149.8 kg)  08/29/20 (!) 329 lb 12.8 oz (149.6 kg)  06/17/20 (!) 329 lb (149.2 kg)   Increase ozempic to '1mg'$  weekly  Mixed hyperlipidemia Reports myalgia with atorvastatin Advised to decrease dose to 3x/week  Wt Readings from Last 3 Encounters:  12/02/20 (!) 330 lb 3.2 oz (149.8 kg)  08/29/20 (!) 329 lb 12.8 oz (149.6 kg)  06/17/20 (!) 329 lb (149.2 kg)    Reviewed past Medical, Social and Family history today.  Outpatient Medications Prior to Visit  Medication Sig Dispense Refill   metoprolol succinate (TOPROL-XL) 200 MG 24 hr tablet Take 1 tablet (200 mg total) by mouth daily. Take with or immediately following a meal. 90 tablet 3   atorvastatin (LIPITOR) 20 MG tablet Take 1 tablet (20 mg total) by mouth daily. 90 tablet 3   metFORMIN (GLUCOPHAGE) 500 MG tablet Take 1 tablet by mouth daily after supper. 90 tablet 1   Semaglutide,0.25 or 0.'5MG'$ /DOS, (OZEMPIC, 0.25 OR 0.5 MG/DOSE,) 2 MG/1.5ML SOPN Inject 0.5 mg into the skin once a week. 1.5 mL 2   losartan (COZAAR) 50 MG tablet Take 1 tablet (50 mg total) by mouth daily. 90 tablet 1   No facility-administered medications prior to visit.    ROS See HPI  Objective:  BP 130/76 (BP Location: Left Arm,  Patient Position: Sitting, Cuff Size: Large)   Pulse 63   Temp (!) 96.8 F (36 C) (Temporal)   Wt (!) 330 lb 3.2 oz (149.8 kg)   SpO2 99%   BMI 53.30 kg/m   Physical Exam Constitutional:      Appearance: She is obese.  Cardiovascular:     Rate and Rhythm: Normal rate.     Pulses: Normal pulses.     Heart sounds: Normal heart sounds.  Pulmonary:     Effort: Pulmonary effort is normal.     Breath sounds: Normal breath sounds.  Abdominal:     General: There is no distension.     Palpations: Abdomen is soft.  Musculoskeletal:     Right lower leg: No edema.     Left lower leg: No edema.  Neurological:     Mental Status: She is alert and oriented to person, place, and time.  Psychiatric:        Mood and Affect: Mood normal.        Behavior: Behavior normal.        Thought Content: Thought content normal.   Assessment & Plan:  This visit occurred during the SARS-CoV-2 public health emergency.  Safety protocols were in place, including screening questions prior to the visit, additional usage of staff PPE, and extensive cleaning of exam room while observing appropriate contact time as indicated for disinfecting solutions.   PMyellewas seen today for  follow-up.  Diagnoses and all orders for this visit:  Essential hypertension  Controlled type 2 diabetes mellitus with other specified complication, without long-term current use of insulin (HCC) -     POCT glycosylated hemoglobin (Hb A1C) -     Discontinue: Semaglutide,0.25 or 0.'5MG'$ /DOS, (OZEMPIC, 0.25 OR 0.5 MG/DOSE,) 2 MG/1.5ML SOPN; Inject 1 mg into the skin once a week. -     metFORMIN (GLUCOPHAGE) 500 MG tablet; Take 1 tablet by mouth daily after supper. -     Semaglutide, 1 MG/DOSE, 4 MG/3ML SOPN; Inject 1 mg as directed once a week.  Mixed hyperlipidemia -     Discontinue: atorvastatin (LIPITOR) 20 MG tablet; Take 1 tablet (20 mg total) by mouth daily. -     atorvastatin (LIPITOR) 20 MG tablet; Take 1 tablet (20 mg total)  by mouth 3 (three) times a week.  Morbid obesity ([place]) -     Discontinue: Semaglutide,0.25 or 0.'5MG'$ /DOS, (OZEMPIC, 0.25 OR 0.5 MG/DOSE,) 2 MG/1.5ML SOPN; Inject 1 mg into the skin once a week.  Problem List Items Addressed This Visit       Cardiovascular and Mediastinum   Essential hypertension - Primary    BP at goal with metoprolol Reports she never took losartan nor HCTZ in past BP Readings from Last 3 Encounters:  12/02/20 130/76  08/29/20 (!) 146/90  06/17/20 122/77   Maintain metoprolol only Refill sent      Relevant Medications   atorvastatin (LIPITOR) 20 MG tablet     Endocrine   Controlled type 2 diabetes mellitus without complication, without long-term current use of insulin (HCC)    Controlled with hgbA1c at 6.8% today Negative urine microalbumin   Maintain metformin dose Increase ozempic to '1mg'$  weekly F/up in 62month      Relevant Medications   metFORMIN (GLUCOPHAGE) 500 MG tablet   atorvastatin (LIPITOR) 20 MG tablet   Semaglutide, 1 MG/DOSE, 4 MG/3ML SOPN     Other   Mixed hyperlipidemia    Reports myalgia with atorvastatin Advised to decrease dose to 3x/week       Relevant Medications   atorvastatin (LIPITOR) 20 MG tablet   Morbid obesity (HCC)    No weight loss noted today Wt Readings from Last 3 Encounters:  12/02/20 (!) 330 lb 3.2 oz (149.8 kg)  08/29/20 (!) 329 lb 12.8 oz (149.6 kg)  06/17/20 (!) 329 lb (149.2 kg)   Increase ozempic to '1mg'$  weekly      Relevant Medications   metFORMIN (GLUCOPHAGE) 500 MG tablet   Semaglutide, 1 MG/DOSE, 4 MG/3ML SOPN    Follow-up: Return in about 3 months (around 03/04/2021) for DM and HTN, hyperlipidemia (fasting).  CWilfred Lacy NP

## 2020-12-02 NOTE — Assessment & Plan Note (Addendum)
Controlled with hgbA1c at 6.8% today Negative urine microalbumin   Maintain metformin dose Increase ozempic to '1mg'$  weekly F/up in 69month

## 2020-12-02 NOTE — Assessment & Plan Note (Signed)
BP at goal with metoprolol Reports she never took losartan nor HCTZ in past BP Readings from Last 3 Encounters:  12/02/20 130/76  08/29/20 (!) 146/90  06/17/20 122/77   Maintain metoprolol only Refill sent

## 2020-12-02 NOTE — Assessment & Plan Note (Signed)
No weight loss noted today Wt Readings from Last 3 Encounters:  12/02/20 (!) 330 lb 3.2 oz (149.8 kg)  08/29/20 (!) 329 lb 12.8 oz (149.6 kg)  06/17/20 (!) 329 lb (149.2 kg)   Increase ozempic to '1mg'$  weekly

## 2020-12-02 NOTE — Patient Instructions (Signed)
Increase ozempic to '1mg'$  weekly Decrease lipitor to '20mg'$  3x/week Maintain metoprolol and metformin dose.

## 2020-12-02 NOTE — Assessment & Plan Note (Signed)
Reports myalgia with atorvastatin Advised to decrease dose to 3x/week

## 2020-12-03 MED ORDER — SEMAGLUTIDE (1 MG/DOSE) 4 MG/3ML ~~LOC~~ SOPN: 1.0000 mg | PEN_INJECTOR | SUBCUTANEOUS | 2 refills | Status: DC

## 2020-12-04 ENCOUNTER — Telehealth: Payer: Self-pay | Admitting: Nurse Practitioner

## 2020-12-04 DIAGNOSIS — E1169 Type 2 diabetes mellitus with other specified complication: Secondary | ICD-10-CM

## 2020-12-04 NOTE — Telephone Encounter (Signed)
Pt is wanting her Semaglutide, 1 MG/DOSE, 4 MG/3ML SOPN [id number] sent to [facility] on [address] from [facility](they are out of this script). Please advise

## 2020-12-05 MED ORDER — SEMAGLUTIDE (1 MG/DOSE) 4 MG/3ML ~~LOC~~ SOPN: 1.0000 mg | PEN_INJECTOR | SUBCUTANEOUS | 2 refills | Status: DC

## 2020-12-05 NOTE — Telephone Encounter (Signed)
Patient notified VIA phone that rx was sent to [facility]. Dm/cma

## 2021-01-02 ENCOUNTER — Telehealth (INDEPENDENT_AMBULATORY_CARE_PROVIDER_SITE_OTHER): Payer: 59 | Admitting: Family Medicine

## 2021-01-02 DIAGNOSIS — U071 COVID-19: Secondary | ICD-10-CM

## 2021-01-02 HISTORY — DX: COVID-19: U07.1

## 2021-01-02 MED ORDER — BENZONATATE 100 MG PO CAPS: 100.0000 mg | ORAL_CAPSULE | Freq: Three times a day (TID) | ORAL | 0 refills | Status: DC | PRN

## 2021-01-02 MED ORDER — NIRMATRELVIR/RITONAVIR (PAXLOVID)TABLET: 3.0000 | ORAL_TABLET | Freq: Two times a day (BID) | ORAL | 0 refills | Status: AC

## 2021-01-02 NOTE — Patient Instructions (Addendum)
HOME CARE TIPS:    -I sent the medication(s) we discussed to your pharmacy: Meds ordered this encounter  Medications   nirmatrelvir/ritonavir EUA (PAXLOVID) 20 x 150 MG & 10 x 100MG TABS    Sig: Take 3 tablets by mouth 2 (two) times daily for 5 days. (Take nirmatrelvir 150 mg two tablets twice daily for 5 days and ritonavir 100 mg one tablet twice daily for 5 days) Patient GFR is 82    Dispense:  30 tablet    Refill:  0   benzonatate (TESSALON PERLES) 100 MG capsule    Sig: Take 1 capsule (100 mg total) by mouth 3 (three) times daily as needed.    Dispense:  20 capsule    Refill:  0     -I sent in the [facility] treatment or referral you requested per our discussion. Please see the information provided below and discuss further with the pharmacist/treatment team.  -If taking Paxlovid, please review all medications, supplement and over the counter drugs with your pharmacist and ask them to check for any interactions. Please make the following changes to your regular medications while taking Paxlovid: *Hold your cholesterol medication Lipitor (atorvastatin) during treatment and restart 3 days after finishing treatment  -If taking Paxlovid, there is a chance of rebound illness after finishing your treatment. If you become sick again please isolate for an additional 5 days.    -can use tylenol if needed for fevers, aches and pains per instructions  -can use nasal saline a few times per day if you have nasal congestion; sometimes  a short course of Afrin nasal spray for 3 days can help with symptoms as well  -stay hydrated, drink plenty of fluids and eat small healthy meals - avoid dairy  -follow up with your doctor in 2-3 days unless improving and feeling better  -stay home while sick, except to seek medical care. If you have COVID19, ideally it would be best to stay home for a full 10 days since the onset of symptoms PLUS one day of no fever and feeling better. Wear a good mask that fits  snugly (such as N95 or KN95) if around others to reduce the risk of transmission.  It was nice to meet you today, and I really hope you are feeling better soon. I help [place] out with telemedicine visits on Tuesdays and Thursdays and am available for visits on those days. If you have any concerns or questions following this visit please schedule a follow up visit with your Primary Care doctor or seek care at a local urgent care clinic to avoid delays in care.    Seek in person care or schedule a follow up video visit promptly if your symptoms worsen, new concerns arise or you are not improving with treatment. Call 911 and/or seek emergency care if your symptoms are severe or life threatening.  FACT SHEET FOR PATIENTS, PARENTS, AND CAREGIVERS EMERGENCY USE AUTHORIZATION (EUA) OF PAXLOVID FOR CORONAVIRUS DISEASE 2019 (COVID-19) You are being given this Fact Sheet because your healthcare provider believes it is necessary to provide you with PAXLOVID for the treatment of mild-to-moderate coronavirus disease (COVID-19) caused by the SARS-CoV-2 virus. This Fact Sheet contains information to help you understand the risks and benefits of taking the PAXLOVID you have received or may receive. The U.S. Food and Drug Administration (FDA) has issued an Emergency Use Authorization (EUA) to make PAXLOVID available during the COVID-19 pandemic (for more details about an EUA please see "What is an  Emergency Use Authorization?" at the end of this document). PAXLOVID is not an FDA-approved medicine in the [place]. Read this Fact Sheet for information about PAXLOVID. Talk to your healthcare provider about your options or if you have any questions. It is your choice to take PAXLOVID.  What is COVID-19? COVID-19 is caused by a virus called a coronavirus. You can get COVID-19 through close contact with another person who has the virus. COVID-19 illnesses have ranged from very mild-to-severe, including  illness resulting in death. While information so far suggests that most COVID-19 illness is mild, serious illness can happen and may cause some of your other medical conditions to become worse. Older people and people of all ages with severe, long lasting (chronic) medical conditions like heart disease, lung disease, and diabetes, for example seem to be at higher risk of being hospitalized for COVID-19.  What is PAXLOVID? PAXLOVID is an investigational medicine used to treat mild-to-moderate COVID-19 in adults and children [71 years of age and older weighing at least 66 pounds (85 kg)] with positive results of direct SARS-CoV-2 viral testing, and who are at high risk for progression to severe COVID-19, including hospitalization or death. PAXLOVID is investigational because it is still being studied. There is limited information about the safety and effectiveness of using PAXLOVID to treat people with mild-to-moderate COVID-19.  The FDA has authorized the emergency use of PAXLOVID for the treatment of mild-tomoderate COVID-19 in adults and children [86 years of age and older weighing at least 49 pounds (80 kg)] with a positive test for the virus that causes COVID-19, and who are at high risk for progression to severe COVID-19, including hospitalization or death, under an EUA. 1 Revised: 19 July 2020   What should I tell my healthcare provider before I take PAXLOVID? Tell your healthcare provider if you: ? Have any allergies ? Have liver or kidney disease ? Are pregnant or plan to become pregnant ? Are breastfeeding a child ? Have any serious illnesses  Tell your healthcare provider about all the medicines you take, including prescription and over-the-counter medicines, vitamins, and herbal supplements. Some medicines may interact with PAXLOVID and may cause serious side effects. Keep a list of your medicines to show your healthcare provider and pharmacist when you get a new  medicine.  You can ask your healthcare provider or pharmacist for a list of medicines that interact with PAXLOVID. Do not start taking a new medicine without telling your healthcare provider. Your healthcare provider can tell you if it is safe to take PAXLOVID with other medicines.  Tell your healthcare provider if you are taking combined hormonal contraceptive. PAXLOVID may affect how your birth control pills work. Females who are able to become pregnant should use another effective alternative form of contraception or an additional barrier method of contraception. Talk to your healthcare provider if you have any questions about contraceptive methods that might be right for you.  How do I take PAXLOVID? ? PAXLOVID consists of 2 medicines: nirmatrelvir and ritonavir. o Take 2 pink tablets of nirmatrelvir with 1 white tablet of ritonavir by mouth 2 times each day (in the morning and in the evening) for 5 days. For each dose, take all 3 tablets at the same time. o If you have kidney disease, talk to your healthcare provider. You may need a different dose. ? Swallow the tablets whole. Do not chew, break, or crush the tablets. ? Take PAXLOVID with or without food. ? Do not stop taking  PAXLOVID without talking to your healthcare provider, even if you feel better. ? If you miss a dose of PAXLOVID within 8 hours of the time it is usually taken, take it as soon as you remember. If you miss a dose by more than 8 hours, skip the missed dose and take the next dose at your regular time. Do not take 2 doses of PAXLOVID at the same time. ? If you take too much PAXLOVID, call your healthcare provider or go to the nearest hospital emergency room right away. ? If you are taking a ritonavir- or cobicistat-containing medicine to treat hepatitis C or Human Immunodeficiency Virus (HIV), you should continue to take your medicine as prescribed by your healthcare provider. 2 Revised: 19 July 2020    Talk  to your healthcare provider if you do not feel better or if you feel worse after 5 days.  Who should generally not take PAXLOVID? Do not take PAXLOVID if: ? You are allergic to nirmatrelvir, ritonavir, or any of the ingredients in PAXLOVID. ? You are taking any of the following medicines: o Alfuzosin o Pethidine, propoxyphene o Ranolazine o Amiodarone, dronedarone, flecainide, propafenone, quinidine o Colchicine o Lurasidone, pimozide, clozapine o Dihydroergotamine, ergotamine, methylergonovine o Lovastatin, simvastatin o Sildenafil (Revatio) for pulmonary arterial hypertension (PAH) o Triazolam, oral midazolam o Apalutamide o Carbamazepine, phenobarbital, phenytoin o Rifampin o St. John's Wort (hypericum perforatum) Taking PAXLOVID with these medicines may cause serious or life-threatening side effects or affect how PAXLOVID works.  These are not the only medicines that may cause serious side effects if taken with PAXLOVID. PAXLOVID may increase or decrease the levels of multiple other medicines. It is very important to tell your healthcare provider about all of the medicines you are taking because additional laboratory tests or changes in the dose of your other medicines may be necessary while you are taking PAXLOVID. Your healthcare provider may also tell you about specific symptoms to watch out for that may indicate that you need to stop or decrease the dose of some of your other medicines.  What are the important possible side effects of PAXLOVID? Possible side effects of PAXLOVID are: ? Allergic Reactions. Allergic reactions can happen in people taking PAXLOVID, even after only 1 dose. Stop taking PAXLOVID and call your healthcare provider right away if you get any of the following symptoms of an allergic reaction: o hives o trouble swallowing or breathing o swelling of the mouth, lips, or face o throat tightness o hoarseness 3 Revised: 19 July 2020  o skin rash ?  Liver Problems. Tell your healthcare provider right away if you have any of these signs and symptoms of liver problems: loss of appetite, yellowing of your skin and the whites of eyes (jaundice), dark-colored urine, pale colored stools and itchy skin, stomach area (abdominal) pain. ? Resistance to HIV Medicines. If you have untreated HIV infection, PAXLOVID may lead to some HIV medicines not working as well in the future. ? Other possible side effects include: o altered sense of taste o diarrhea o high blood pressure o muscle aches These are not all the possible side effects of PAXLOVID. Not many people have taken PAXLOVID. Serious and unexpected side effects may happen. PAXLOVID is still being studied, so it is possible that all of the risks are not known at this time.  What other treatment choices are there? Veklury (remdesivir) is FDA-approved for the treatment of mild-to-moderate UEKCM-03 in certain adults and children. Talk with your doctor to see  if Marijean Heath is appropriate for you. Like PAXLOVID, FDA may also allow for the emergency use of other medicines to treat people with COVID-19. Go to https://price.info/ for information on the emergency use of other medicines that are authorized by FDA to treat people with COVID-19. Your healthcare provider may talk with you about clinical trials for which you may be eligible. It is your choice to be treated or not to be treated with PAXLOVID. Should you decide not to receive it or for your child not to receive it, it will not change your standard medical care.  What if I am pregnant or breastfeeding? There is no experience treating pregnant women or breastfeeding mothers with PAXLOVID. For a mother and unborn baby, the benefit of taking PAXLOVID may be greater than the risk from the treatment. If you are pregnant, discuss your options  and specific situation with your healthcare provider. It is recommended that you use effective barrier contraception or do not have sexual activity while taking PAXLOVID. If you are breastfeeding, discuss your options and specific situation with your healthcare provider. 4 Revised: 19 July 2020   How do I report side effects with PAXLOVID? Contact your healthcare provider if you have any side effects that bother you or do not go away. Report side effects to FDA MedWatch at SmoothHits.hu or call 1-800-FDA1088 or you can report side effects to [employer]. at the contact information provided below. Website Fax number Telephone number www.pfizersafetyreporting.com 8123600933 [phone] How should I store [place]? Store PAXLOVID tablets at room temperature, between 68?F to 77?F (20?C to 25?C). How can I learn more about COVID-19? ? Ask your healthcare provider. ? Visit https://jacobson-johnson.com/. ? Contact your local or state public health department. What is an Emergency Use Authorization (EUA)? The [place] FDA has made PAXLOVID available under an emergency access mechanism called an Emergency Use Authorization (EUA). The EUA is supported by a Education officer, museum and Human Service (HHS) declaration that circumstances exist to justify the emergency use of drugs and biological products during the COVID-19 pandemic. PAXLOVID for the treatment of mild-to-moderate COVID-19 in adults and children [62 years of age and older weighing at least 48 pounds (72 kg)] with positive results of direct SARS-CoV-2 viral testing, and who are at high risk for progression to severe COVID-19, including hospitalization or death, has not undergone the same type of review as an FDA-approved product. In issuing an EUA under the ZLDJT-70 public health emergency, the FDA has determined, among other things, that based on the total amount of scientific evidence available including data from  adequate and well-controlled clinical trials, if available, it is reasonable to believe that the product may be effective for diagnosing, treating, or preventing COVID-19, or a serious or life-threatening disease or condition caused by COVID-19; that the known and potential benefits of the product, when used to diagnose, treat, or prevent such disease or condition, outweigh the known and potential risks of such product; and that there are no adequate, approved, and available alternatives. All of these criteria must be met to allow for the product to be used in the treatment of patients during the COVID-19 pandemic. The EUA for PAXLOVID is in effect for the duration of the COVID-19 declaration justifying emergency use of this product, unless terminated or revoked (after which the products may no longer be used under the EUA). 5 Revised: 19 July 2020     Additional Information For general questions, visit the website or call the telephone number provided below.  Website Telephone number www.COVID19oralRx.com ([phone] (1-877-C19-PACK) You can also go to www.pfizermedinfo.com or call [phone] for more information. [id number] Revised: 19 July 2020

## 2021-01-02 NOTE — Progress Notes (Signed)
Virtual Visit via Video Note  I connected with Marissa Barrera  on 01/02/21 at  1:00 PM EDT by a video enabled telemedicine application and verified that I am speaking with the correct person using two identifiers.  Location patient: home,  Location provider:work or home office Persons participating in the virtual visit: patient, provider  I discussed the limitations of evaluation and management by telemedicine and the availability of in person appointments. The patient expressed understanding and agreed to proceed.   HPI:  Acute telemedicine visit for Covid19: -Onset: 2 days ago; tested positive for covid19 today  -Symptoms include: sore throat, cough, mild headache, runny nose -Denies: fevers, NVD, CP, SOB, inability to eat/drink/get out of bed -Pertinent past medical history: see below -Pertinent medication allergies:  Allergies  Allergen Reactions   Ace Inhibitors Cough    Cough    Penicillins Hives  -COVID-19 vaccine status: 2 doses + 2 booster -GFR 82 the end of April  ROS: See pertinent positives and negatives per HPI.  Past Medical History:  Diagnosis Date   Diabetes type 2, controlled ([place]) 06/27/2014   Hyperglycemia 01/14/2014   Hypertension    Morbid obesity ([place])     Past Surgical History:  Procedure Laterality Date   CHOLECYSTECTOMY  1998   FOOT SURGERY Bilateral 1990   pinched nerve   GASTRIC BYPASS  1997     Current Outpatient Medications:    benzonatate (TESSALON PERLES) 100 MG capsule, Take 1 capsule (100 mg total) by mouth 3 (three) times daily as needed., Disp: 20 capsule, Rfl: 0   nirmatrelvir/ritonavir EUA (PAXLOVID) 20 x 150 MG & 10 x '100MG'$  TABS, Take 3 tablets by mouth 2 (two) times daily for 5 days. (Take nirmatrelvir 150 mg two tablets twice daily for 5 days and ritonavir 100 mg one tablet twice daily for 5 days) Patient GFR is 82, Disp: 30 tablet, Rfl: 0   atorvastatin (LIPITOR) 20 MG tablet, Take 1 tablet (20 mg total) by mouth 3 (three) times a  week., Disp: 45 tablet, Rfl: 2   metFORMIN (GLUCOPHAGE) 500 MG tablet, Take 1 tablet by mouth daily after supper., Disp: 90 tablet, Rfl: 1   metoprolol succinate (TOPROL-XL) 200 MG 24 hr tablet, Take 1 tablet (200 mg total) by mouth daily. Take with or immediately following a meal., Disp: 90 tablet, Rfl: 3   Semaglutide, 1 MG/DOSE, 4 MG/3ML SOPN, Inject 1 mg as directed once a week., Disp: 3 mL, Rfl: 2  EXAM:  VITALS per patient if applicable:  GENERAL: alert, oriented, appears well and in no acute distress  HEENT: atraumatic, conjunttiva clear, no obvious abnormalities on inspection of external nose and ears  NECK: normal movements of the head and neck  LUNGS: on inspection no signs of respiratory distress, breathing rate appears normal, no obvious gross SOB, gasping or wheezing  CV: no obvious cyanosis  MS: moves all visible extremities without noticeable abnormality  PSYCH/NEURO: pleasant and cooperative, no obvious depression or anxiety, speech and thought processing grossly intact  ASSESSMENT AND PLAN:  Discussed the following assessment and plan:  COVID-19   Discussed treatment options (infusions and oral options and risk of drug interactions), ideal treatment window, potential complications, isolation and precautions for COVID-19.  Discussed possibility of rebound with antivirals and the need to reisolate if it should occur for 5 days. Checked for/reviewed any labs done in the last 90 days with GFR listed in HPI if available.  After lengthy discussion, the patient opted for treatment with Paxlovid due  to being higher risk for complications of covid or severe disease and other factors. Discussed EUA status of this drug and the fact that there is preliminary limited knowledge of risks/interactions/side effects per EUA document vs possible benefits and precautions. She is agreeable to holding her statin. This information was shared with patient during the visit and also was provided  in patient instructions. Also, advised that patient discuss risks/interactions and use with pharmacist/treatment team as well. The patient did want a prescription for cough, Tessalon Rx sent.  Other symptomatic care measures summarized in patient instructions. Work/School slipped offered: declined Advised to seek prompt in person care if worsening, new symptoms arise, or if is not improving with treatment. Discussed options for inperson care if PCP office not available. Did let this patient know that I only do telemedicine on Tuesdays and Thursdays for [place]. Advised to schedule follow up visit with PCP or [facility] if any further questions or concerns to avoid delays in care.   I discussed the assessment and treatment plan with the patient. The patient was provided an opportunity to ask questions and all were answered. The patient agreed with the plan and demonstrated an understanding of the instructions.     Marissa Kern, DO

## 2021-02-25 ENCOUNTER — Other Ambulatory Visit (HOSPITAL_BASED_OUTPATIENT_CLINIC_OR_DEPARTMENT_OTHER): Payer: Self-pay | Admitting: Nurse Practitioner

## 2021-02-25 DIAGNOSIS — Z1231 Encounter for screening mammogram for malignant neoplasm of breast: Secondary | ICD-10-CM

## 2021-03-03 ENCOUNTER — Other Ambulatory Visit: Payer: Self-pay | Admitting: Family

## 2021-03-03 ENCOUNTER — Ambulatory Visit (HOSPITAL_BASED_OUTPATIENT_CLINIC_OR_DEPARTMENT_OTHER): Payer: 59

## 2021-03-03 ENCOUNTER — Telehealth: Payer: Self-pay | Admitting: Nurse Practitioner

## 2021-03-03 DIAGNOSIS — I10 Essential (primary) hypertension: Secondary | ICD-10-CM

## 2021-03-03 DIAGNOSIS — E1169 Type 2 diabetes mellitus with other specified complication: Secondary | ICD-10-CM

## 2021-03-03 MED ORDER — METOPROLOL SUCCINATE ER 200 MG PO TB24: 200.0000 mg | ORAL_TABLET | Freq: Every day | ORAL | 3 refills | Status: DC

## 2021-03-03 MED ORDER — METFORMIN HCL 500 MG PO TABS: ORAL_TABLET | ORAL | 1 refills | Status: DC

## 2021-03-03 NOTE — Telephone Encounter (Signed)
Had to resched med check with [facility], via provider being out.  I found a spot on [facility]'s schedule but pt will need refills before then.   metFORMIN and metoprolol.Marland KitchenMarland Kitchen

## 2021-03-04 ENCOUNTER — Ambulatory Visit: Payer: 59 | Admitting: Family Medicine

## 2021-03-05 ENCOUNTER — Ambulatory Visit: Payer: 59 | Admitting: Family Medicine

## 2021-03-17 ENCOUNTER — Other Ambulatory Visit: Payer: Self-pay

## 2021-03-17 ENCOUNTER — Encounter: Payer: Self-pay | Admitting: Nurse Practitioner

## 2021-03-17 ENCOUNTER — Ambulatory Visit (INDEPENDENT_AMBULATORY_CARE_PROVIDER_SITE_OTHER): Payer: 59 | Admitting: Nurse Practitioner

## 2021-03-17 VITALS — BP 126/80 | HR 78 | Temp 96.7°F | Wt 335.6 lb

## 2021-03-17 DIAGNOSIS — I10 Essential (primary) hypertension: Secondary | ICD-10-CM | POA: Diagnosis not present

## 2021-03-17 DIAGNOSIS — E119 Type 2 diabetes mellitus without complications: Secondary | ICD-10-CM | POA: Diagnosis not present

## 2021-03-17 DIAGNOSIS — Z23 Encounter for immunization: Secondary | ICD-10-CM

## 2021-03-17 DIAGNOSIS — E782 Mixed hyperlipidemia: Secondary | ICD-10-CM | POA: Diagnosis not present

## 2021-03-17 LAB — COMPREHENSIVE METABOLIC PANEL
ALT: 15 U/L (ref 0–35)
AST: 14 U/L (ref 0–37)
Albumin: 4.2 g/dL (ref 3.5–5.2)
Alkaline Phosphatase: 79 U/L (ref 39–117)
BUN: 9 mg/dL (ref 6–23)
CO2: 27 mEq/L (ref 19–32)
Calcium: 9.1 mg/dL (ref 8.4–10.5)
Chloride: 103 mEq/L (ref 96–112)
Creatinine, Ser: 0.75 mg/dL (ref 0.40–1.20)
GFR: 89.64 mL/min (ref >60.00)
Glucose, Bld: 113 mg/dL — ABNORMAL HIGH (ref 70–99)
Potassium: 4.4 mEq/L (ref 3.5–5.1)
Sodium: 138 mEq/L (ref 135–145)
Total Bilirubin: 0.6 mg/dL (ref 0.2–1.2)
Total Protein: 7.1 g/dL (ref 6.0–8.3)

## 2021-03-17 LAB — LIPID PANEL
Cholesterol: 159 mg/dL (ref 0–200)
HDL: 49.3 mg/dL (ref >39.00)
LDL Cholesterol: 82 mg/dL (ref 0–99)
NonHDL: 109.77
Total CHOL/HDL Ratio: 3
Triglycerides: 139 mg/dL (ref 0.0–149.0)
VLDL: 27.8 mg/dL (ref 0.0–40.0)

## 2021-03-17 LAB — HEMOGLOBIN A1C: Hgb A1c MFr Bld: 6.7 % — ABNORMAL HIGH (ref 4.6–6.5)

## 2021-03-17 MED ORDER — METFORMIN HCL 500 MG PO TABS: ORAL_TABLET | ORAL | 1 refills | Status: DC

## 2021-03-17 MED ORDER — ATORVASTATIN CALCIUM 20 MG PO TABS: 20.0000 mg | ORAL_TABLET | ORAL | 2 refills | Status: DC

## 2021-03-17 MED ORDER — METOPROLOL SUCCINATE ER 200 MG PO TB24: 200.0000 mg | ORAL_TABLET | Freq: Every day | ORAL | 3 refills | Status: DC

## 2021-03-17 MED ORDER — SEMAGLUTIDE (1 MG/DOSE) 4 MG/3ML ~~LOC~~ SOPN: 1.0000 mg | PEN_INJECTOR | SUBCUTANEOUS | 2 refills | Status: DC

## 2021-03-17 NOTE — Assessment & Plan Note (Signed)
5pounds weight gain noted No exercise regimen. She has joined  [employer] and plans to start. Wt Readings from Last 3 Encounters:  03/17/21 (!) 335 lb 9.6 oz (152.2 kg)  12/02/20 (!) 330 lb 3.2 oz (149.8 kg)  08/29/20 (!) 329 lb 12.8 oz (149.6 kg)   Advised about the importance of life style modifications (heart healthy diet, decrease daily calorie intake and exercise) to promote weight loss Maintain ozempic dose

## 2021-03-17 NOTE — Assessment & Plan Note (Signed)
BP at goal with metoprolol BP Readings from Last 3 Encounters:  03/17/21 126/80  12/02/20 130/76  08/29/20 (!) 146/90   Refill sent

## 2021-03-17 NOTE — Assessment & Plan Note (Signed)
Repeat lipid panel and CMP No adverse effects with atorvastatin Refill sent

## 2021-03-17 NOTE — Assessment & Plan Note (Addendum)
No glucose check at home Completed Dm eye exam with [facility] in [facility]. Report requested. Agreed to influenza and pneumovax 20 today. Denies any adverse side effects with metformin and ozempic  Refill sent Repeat CMP and hgbA1c

## 2021-03-17 NOTE — Progress Notes (Signed)
Subjective:  Patient ID: Marissa Barrera, female    DOB: [birth year]  Age: 55 y.o. MRN: [id number]  CC: Follow-up (Pt states she is in need of medication refills for Ozempic. F/u on DM and HTN. )  HPI  Essential hypertension BP at goal with metoprolol BP Readings from Last 3 Encounters:  03/17/21 126/80  12/02/20 130/76  08/29/20 (!) 146/90   Refill sent  Controlled type 2 diabetes mellitus without complication, without long-term current use of insulin ([place]) No glucose check at home Completed Dm eye exam with [facility] in [facility]. Report requested. Agreed to influenza and pneumovax 20 today. Denies any adverse side effects with metformin and ozempic  Refill sent Repeat CMP and hgbA1c  Mixed hyperlipidemia Repeat lipid panel and CMP No adverse effects with atorvastatin Refill sent  Morbid obesity ([place]) 5pounds weight gain noted No exercise regimen. She has joined  A gym and plans to start. Wt Readings from Last 3 Encounters:  03/17/21 (!) 335 lb 9.6 oz (152.2 kg)  12/02/20 (!) 330 lb 3.2 oz (149.8 kg)  08/29/20 (!) 329 lb 12.8 oz (149.6 kg)   Advised about the importance of life style modifications (heart healthy diet, decrease daily calorie intake and exercise) to promote weight loss Maintain ozempic dose  Wt Readings from Last 3 Encounters:  03/17/21 (!) 335 lb 9.6 oz (152.2 kg)  12/02/20 (!) 330 lb 3.2 oz (149.8 kg)  08/29/20 (!) 329 lb 12.8 oz (149.6 kg)    BP Readings from Last 3 Encounters:  03/17/21 126/80  12/02/20 130/76  08/29/20 (!) 146/90    Reviewed past Medical, Social and Family history today.  Outpatient Medications Prior to Visit  Medication Sig Dispense Refill   atorvastatin (LIPITOR) 20 MG tablet Take 1 tablet (20 mg total) by mouth 3 (three) times a week. 45 tablet 2   metFORMIN (GLUCOPHAGE) 500 MG tablet Take 1 tablet by mouth daily after supper. 90 tablet 1   metoprolol (TOPROL-XL) 200 MG 24 hr tablet Take 1 tablet (200 mg total)  by mouth daily. Take with or immediately following a meal. 90 tablet 3   Semaglutide, 1 MG/DOSE, 4 MG/3ML SOPN Inject 1 mg as directed once a week. 3 mL 2   benzonatate (TESSALON PERLES) 100 MG capsule Take 1 capsule (100 mg total) by mouth 3 (three) times daily as needed. (Patient not taking: Reported on 03/17/2021) 20 capsule 0   No facility-administered medications prior to visit.   ROS See HPI  Objective:  BP 126/80 (BP Location: Left Arm, Patient Position: Sitting, Cuff Size: Large)   Pulse 78   Temp (!) 96.7 F (35.9 C) (Temporal)   Wt (!) 335 lb 9.6 oz (152.2 kg)   SpO2 98%   BMI 54.17 kg/m   Physical Exam Constitutional:      Appearance: She is obese.  Cardiovascular:     Rate and Rhythm: Normal rate and regular rhythm.     Pulses: Normal pulses.     Heart sounds: Normal heart sounds.  Pulmonary:     Effort: Pulmonary effort is normal.     Breath sounds: Normal breath sounds.  Musculoskeletal:     Right lower leg: No edema.     Left lower leg: No edema.  Neurological:     Mental Status: She is alert and oriented to person, place, and time.  Psychiatric:        Mood and Affect: Mood normal.        Behavior:  Behavior normal.    Assessment & Plan:  This visit occurred during the SARS-CoV-2 public health emergency.  Safety protocols were in place, including screening questions prior to the visit, additional usage of staff PPE, and extensive cleaning of exam room while observing appropriate contact time as indicated for disinfecting solutions.   Twyla was seen today for follow-up.  Diagnoses and all orders for this visit:  Controlled type 2 diabetes mellitus without complication, without long-term current use of insulin (HCC) -     Comprehensive metabolic panel -     Hemoglobin A1c -     metFORMIN (GLUCOPHAGE) 500 MG tablet; Take 1 tablet by mouth daily after supper. -     Semaglutide, 1 MG/DOSE, 4 MG/3ML SOPN; Inject 1 mg as directed once a week.  Essential  hypertension -     Comprehensive metabolic panel -     metoprolol (TOPROL-XL) 200 MG 24 hr tablet; Take 1 tablet (200 mg total) by mouth daily. Take with or immediately following a meal.  Mixed hyperlipidemia -     Comprehensive metabolic panel -     Lipid panel -     atorvastatin (LIPITOR) 20 MG tablet; Take 1 tablet (20 mg total) by mouth 3 (three) times a week.  Morbid obesity ([place])  Problem List Items Addressed This Visit       Cardiovascular and Mediastinum   Essential hypertension    BP at goal with metoprolol BP Readings from Last 3 Encounters:  03/17/21 126/80  12/02/20 130/76  08/29/20 (!) 146/90   Refill sent      Relevant Medications   atorvastatin (LIPITOR) 20 MG tablet   metoprolol (TOPROL-XL) 200 MG 24 hr tablet   Other Relevant Orders   Comprehensive metabolic panel     Endocrine   Controlled type 2 diabetes mellitus without complication, without long-term current use of insulin (HCC) - Primary    No glucose check at home Completed Dm eye exam with [facility] in [facility]. Report requested. Agreed to influenza and pneumovax 20 today. Denies any adverse side effects with metformin and ozempic  Refill sent Repeat CMP and hgbA1c      Relevant Medications   atorvastatin (LIPITOR) 20 MG tablet   metFORMIN (GLUCOPHAGE) 500 MG tablet   Semaglutide, 1 MG/DOSE, 4 MG/3ML SOPN   Other Relevant Orders   Comprehensive metabolic panel   Hemoglobin A1c     Other   Mixed hyperlipidemia    Repeat lipid panel and CMP No adverse effects with atorvastatin Refill sent      Relevant Medications   atorvastatin (LIPITOR) 20 MG tablet   metoprolol (TOPROL-XL) 200 MG 24 hr tablet   Other Relevant Orders   Comprehensive metabolic panel   Lipid panel   Morbid obesity (HCC)    5pounds weight gain noted No exercise regimen. She has joined  A gym and plans to start. Wt Readings from Last 3 Encounters:  03/17/21 (!) 335 lb 9.6 oz (152.2 kg)  12/02/20 (!) 330  lb 3.2 oz (149.8 kg)  08/29/20 (!) 329 lb 12.8 oz (149.6 kg)   Advised about the importance of life style modifications (heart healthy diet, decrease daily calorie intake and exercise) to promote weight loss Maintain ozempic dose      Relevant Medications   metFORMIN (GLUCOPHAGE) 500 MG tablet   Semaglutide, 1 MG/DOSE, 4 MG/3ML SOPN    Follow-up: Return in about 6 months (around 09/14/2021) for CPE (fasting).  Wilfred Lacy, NP

## 2021-03-17 NOTE — Patient Instructions (Addendum)
Sign medical release to get records from Dr. Orvan Seen (Eye care center) . Go to lab for blood draw

## 2021-04-08 ENCOUNTER — Encounter (HOSPITAL_BASED_OUTPATIENT_CLINIC_OR_DEPARTMENT_OTHER): Payer: Self-pay

## 2021-04-08 ENCOUNTER — Other Ambulatory Visit: Payer: Self-pay

## 2021-04-08 ENCOUNTER — Ambulatory Visit (HOSPITAL_BASED_OUTPATIENT_CLINIC_OR_DEPARTMENT_OTHER)
Admission: RE | Admit: 2021-04-08 | Discharge: 2021-04-08 | Disposition: A | Discharge status: Home / Self Care | Payer: 59 | Source: Ambulatory Visit | Attending: Nurse Practitioner | Admitting: Nurse Practitioner

## 2021-04-08 DIAGNOSIS — Z1231 Encounter for screening mammogram for malignant neoplasm of breast: Secondary | ICD-10-CM | POA: Insufficient documentation

## 2021-04-14 ENCOUNTER — Encounter: Payer: Self-pay | Admitting: General Practice

## 2021-05-15 ENCOUNTER — Encounter: Payer: Self-pay | Admitting: Nurse Practitioner

## 2021-09-24 ENCOUNTER — Other Ambulatory Visit: Payer: Self-pay | Admitting: Nurse Practitioner

## 2021-09-24 ENCOUNTER — Encounter: Payer: Self-pay | Admitting: Obstetrics & Gynecology

## 2021-09-24 ENCOUNTER — Ambulatory Visit (INDEPENDENT_AMBULATORY_CARE_PROVIDER_SITE_OTHER): Payer: 59 | Admitting: Obstetrics & Gynecology

## 2021-09-24 VITALS — BP 145/70 | HR 74 | Ht 66.0 in | Wt 343.0 lb

## 2021-09-24 DIAGNOSIS — E782 Mixed hyperlipidemia: Secondary | ICD-10-CM

## 2021-09-24 DIAGNOSIS — N924 Excessive bleeding in the premenopausal period: Secondary | ICD-10-CM

## 2021-09-24 NOTE — Progress Notes (Signed)
Patient having cramping. She did only have spotting one day. Patient states that she has had kyleena IUD since 03/2020 when a hysteroscopy/ polypectomy was performed. (Dr. Forestine [facility])  Kathrene Alu RN

## 2021-09-24 NOTE — Progress Notes (Signed)
History:  56 y.o. G0P0000 here today for PMPB. Pt reports that she has had spotting and cramping. She is not sure why the spotting which has been infrequent sn and light. Her biggest complaint is the cramping. She feels like she's having ovulatory pain or period cramps.     The following portions of the patient's history were reviewed and updated as appropriate: allergies, current medications, past family history, past medical history, past social history, past surgical history and problem list.  Review of Systems:  Pertinent items are noted in HPI.    Objective:  Physical Exam Blood pressure (!) 145/70, pulse 74, height '5\' 6"'$  (1.676 m), weight (!) 343 lb (155.6 kg).  CONSTITUTIONAL: Well-developed, well-nourished female in no acute distress.  HENT:  Normocephalic, atraumatic EYES: Conjunctivae and EOM are normal. No scleral icterus.  NECK: Normal range of motion SKIN: Skin is warm and dry. No rash noted. Not diaphoretic.No pallor. [place]: Alert and oriented to person, place, and time. Normal coordination.  Pelvic: not done  Assessment & Plan:  PMPB. Reviewed prior chart and Endo bx results from 2021. Although the pt had a bx in 2021, she has risk factors for endo ca so I recommend a reeval. Pt would like the [place] prior to an exam. If there is no need for the IUD, she'd like to have it removed.   Diagnoses and all orders for this visit:  Menopausal bleeding -     US Pelvis Complete; Future -     US Transvaginal Non-OB; Future   F/u in 3 weeks for Endo bx or removal of IUD or both F/u sooner prn   Total face-to-face time with patient, review of chart, discussion with consultant and coordination of care was 78mn.    Loras Grieshop L. Harraway-Smith, M.D., FCherlynn June

## 2021-09-30 ENCOUNTER — Ambulatory Visit (HOSPITAL_BASED_OUTPATIENT_CLINIC_OR_DEPARTMENT_OTHER)
Admission: RE | Admit: 2021-09-30 | Discharge: 2021-09-30 | Disposition: A | Discharge status: Home / Self Care | Payer: 59 | Source: Ambulatory Visit | Attending: Obstetrics & Gynecology | Admitting: Obstetrics & Gynecology

## 2021-09-30 ENCOUNTER — Other Ambulatory Visit: Payer: Self-pay | Admitting: Obstetrics & Gynecology

## 2021-09-30 DIAGNOSIS — N924 Excessive bleeding in the premenopausal period: Secondary | ICD-10-CM | POA: Insufficient documentation

## 2021-10-15 ENCOUNTER — Other Ambulatory Visit (HOSPITAL_COMMUNITY)
Admission: RE | Admit: 2021-10-15 | Discharge: 2021-10-15 | Disposition: A | Discharge status: Home / Self Care | Payer: 59 | Source: Ambulatory Visit | Attending: Obstetrics & Gynecology | Admitting: Obstetrics & Gynecology

## 2021-10-15 ENCOUNTER — Encounter: Payer: Self-pay | Admitting: Obstetrics & Gynecology

## 2021-10-15 ENCOUNTER — Ambulatory Visit (INDEPENDENT_AMBULATORY_CARE_PROVIDER_SITE_OTHER): Payer: 59 | Admitting: Obstetrics & Gynecology

## 2021-10-15 VITALS — BP 135/81 | HR 76 | Ht 66.0 in | Wt 343.0 lb

## 2021-10-15 DIAGNOSIS — R9389 Abnormal findings on diagnostic imaging of other specified body structures: Secondary | ICD-10-CM | POA: Diagnosis not present

## 2021-10-15 DIAGNOSIS — N950 Postmenopausal bleeding: Secondary | ICD-10-CM

## 2021-10-15 NOTE — Progress Notes (Signed)
Patient here for Ultrasound results. NO IUD visualized on ultrasound. Kathrene Alu RN

## 2021-10-15 NOTE — Progress Notes (Signed)
History:  56 y.o. G0P0000 here today for f/u of PMPB. Pt reports that since her last visit, she has not had bleeding. The [place] did not show an IUD. Pt reports that she is not sure what happened. She did mention that some time after it was placed, she noted pain in the vagina. The pain lasted a while then seemed to resolve. She never saw an IUD come out. She was having heavy bleeding at the time. The IUD was placed intraop following a hysteroscopy at outside [place]. I see the surg path from 03/19/2020 however, I do not see the OP note.    The following portions of the patient's history were reviewed and updated as appropriate: allergies, current medications, past family history, past medical history, past social history, past surgical history and problem list.  Review of Systems:  Pertinent items are noted in HPI.    Objective:  Physical Exam Blood pressure 135/81, pulse 76, height '5\' 6"'$  (1.676 m), weight (!) 343 lb (155.6 kg).  CONSTITUTIONAL: Well-developed, well-nourished female in no acute distress.  HENT:  Normocephalic, atraumatic EYES: Conjunctivae and EOM are normal. No scleral icterus.  NECK: Normal range of motion SKIN: Skin is warm and dry. No rash noted. Not diaphoretic.No pallor. [place]: Alert and oriented to person, place, and time. Normal coordination.   Pelvic: Normal appearing external genitalia; normal appearing vaginal mucosa and cervix.  Normal discharge.   GYN procedure: The indications for endometrial biopsy were reviewed.   Risks of the biopsy including cramping, bleeding, infection, uterine perforation, inadequate specimen and need for additional procedures  were discussed. The patient states she understands and agrees to undergo procedure today. Consent was signed. Time out was performed. Urine HCG was negative. A sterile speculum was placed in the patient's vagina and the cervix was prepped with Betadine. A single-toothed tenaculum was placed on the anterior lip of the  cervix to stabilize it. The 3 mm pipelle was introduced into the endometrial cavity without difficulty to a depth of  >13 cm, and a moderate amount of tissue was obtained and sent to pathology. The instruments were removed from the patient's vagina. Minimal bleeding from the cervix was noted. The patient tolerated the procedure well.   Labs and Imaging US PELVIC COMPLETE WITH TRANSVAGINAL  Result Date: 09/30/2021 CLINICAL DATA:  Postmenopausal bleeding, reportedly has IUD EXAM: TRANSABDOMINAL AND TRANSVAGINAL ULTRASOUND OF PELVIS TECHNIQUE: Both transabdominal and transvaginal ultrasound examinations of the pelvis were performed. Transabdominal technique was performed for global imaging of the pelvis including uterus, ovaries, adnexal regions, and pelvic cul-de-sac. It was necessary to proceed with endovaginal exam following the transabdominal exam to visualize the endometrium and ovaries. COMPARISON:  None FINDINGS: Uterus Measurements: 13.4 x 10.5 x 11.1 = volume: 812 mL. Image quality degraded secondary to body habitus, shadowing from uterus, and bowel. Heterogeneous appearance of myometrium. No discrete mass Endometrium Thickness: 9 mm at mid uterus. Poorly visualized at upper uterine segment, questionably 14 mm. No endometrial fluid or mass Right ovary Not visualized, likely obscured by bowel Left ovary Not visualized, likely obscured by bowel Other findings No free pelvic fluid.  No adnexal masses. IMPRESSION: Nonvisualization of ovaries. Endometrial complex appears thickened, 9 mm at mid uterus, questionably 14 mm at upper uterine segment; In the setting of post-menopausal bleeding, endometrial sampling is indicated to exclude carcinoma. If results are benign, sonohysterogram should be considered for focal lesion work-up. (Ref: Radiological Reasoning: Algorithmic Workup of Abnormal Vaginal Bleeding with Endovaginal Sonography and Sonohysterography. AJR 2008; 409:W11-91).  Enlarged uterus without  visualization of an IUD. Electronically Signed   By: Lavonia Dana M.D.   On: 09/30/2021 14:34      Assessment & Plan:  PMPB s/p endo bx Routine post-procedure instructions were given to the patient. The patient will follow up to review the results and for further management.     F/u post results or sooner prn   Lacheryl Niesen L. Harraway-Smith, M.D., Cherlynn June

## 2021-10-15 NOTE — Patient Instructions (Signed)

## 2021-10-17 LAB — SURGICAL PATHOLOGY

## 2021-10-20 ENCOUNTER — Telehealth: Payer: Self-pay

## 2021-10-20 DIAGNOSIS — T8332XS Displacement of intrauterine contraceptive device, sequela: Secondary | ICD-10-CM

## 2021-10-20 NOTE — Telephone Encounter (Signed)
Patient called and made aware that her endometrial biopsy was negative for any cancer or polyps. Patient also made aware that Dr. Ihor Dow would like to order a xray to see if her IUD can been seen (was not seen on ultrasound). Kathrene Alu RN

## 2021-10-21 ENCOUNTER — Ambulatory Visit (HOSPITAL_BASED_OUTPATIENT_CLINIC_OR_DEPARTMENT_OTHER)
Admission: RE | Admit: 2021-10-21 | Discharge: 2021-10-21 | Disposition: A | Discharge status: Home / Self Care | Payer: 59 | Source: Ambulatory Visit | Attending: Obstetrics & Gynecology | Admitting: Obstetrics & Gynecology

## 2021-10-21 DIAGNOSIS — T8332XS Displacement of intrauterine contraceptive device, sequela: Secondary | ICD-10-CM | POA: Insufficient documentation

## 2021-10-22 ENCOUNTER — Other Ambulatory Visit: Payer: Self-pay | Admitting: Obstetrics & Gynecology

## 2021-10-22 ENCOUNTER — Telehealth: Payer: Self-pay | Admitting: Obstetrics & Gynecology

## 2021-10-22 DIAGNOSIS — T8332XD Displacement of intrauterine contraceptive device, subsequent encounter: Secondary | ICD-10-CM

## 2021-10-22 NOTE — Telephone Encounter (Signed)
TC to pt. I discussed with her that I am not sure where her IUD is. She looked at the x-rat and wondered.  She is willing to get the lateral x-ray to see if the IUD is within the uterus or behind the uterus.  Pt did not go back to have her strings checked and it was placed in the OR.   Will order lateral and then discuss further after that.   All questions answered.   Jaquia Benedicto L. Harraway-Smith, M.D., Cher[place]nn June

## 2021-10-23 ENCOUNTER — Ambulatory Visit (HOSPITAL_BASED_OUTPATIENT_CLINIC_OR_DEPARTMENT_OTHER)
Admission: RE | Admit: 2021-10-23 | Discharge: 2021-10-23 | Disposition: A | Discharge status: Home / Self Care | Payer: 59 | Source: Ambulatory Visit | Attending: Obstetrics & Gynecology | Admitting: Obstetrics & Gynecology

## 2021-10-23 ENCOUNTER — Other Ambulatory Visit: Payer: Self-pay | Admitting: Obstetrics & Gynecology

## 2021-10-23 DIAGNOSIS — T8332XD Displacement of intrauterine contraceptive device, subsequent encounter: Secondary | ICD-10-CM

## 2021-10-24 ENCOUNTER — Telehealth: Payer: Self-pay | Admitting: Obstetrics & Gynecology

## 2021-12-12 ENCOUNTER — Encounter (HOSPITAL_COMMUNITY): Payer: Self-pay | Admitting: Obstetrics & Gynecology

## 2021-12-12 NOTE — Progress Notes (Signed)
Spoke with pt for pre-op call. Pt states she had palpitations years ago but they were due to anxiety and she no longer has them. Pt is treated for HTN and is diabetic. Pt's most recent A1C was 6.2 on 06/25/21. She states she has a meter but does not use it. I did ask her to check her blood sugar Monday when she wakes up and every 2 hours until she leaves for the hospital. If blood sugar is 70 or below, treat with 1/2 cup of clear juice (apple or cranberry) and recheck blood sugar 15 minutes after drinking juice. If blood sugar continues to be 70 or below, call the Short Stay department and ask to speak to a nurse. Pt voiced understanding.   Shower instructions given to pt and she voiced understanding.

## 2021-12-15 ENCOUNTER — Encounter (HOSPITAL_COMMUNITY): Payer: Self-pay | Admitting: Obstetrics & Gynecology

## 2021-12-15 ENCOUNTER — Ambulatory Visit (HOSPITAL_COMMUNITY)
Admission: RE | Admit: 2021-12-15 | Discharge: 2021-12-15 | Disposition: A | Discharge status: Home / Self Care | Payer: 59 | Attending: Obstetrics & Gynecology | Admitting: Obstetrics & Gynecology

## 2021-12-15 ENCOUNTER — Encounter (HOSPITAL_COMMUNITY)
Admission: RE | Disposition: A | Discharge status: Home / Self Care | Payer: Self-pay | Source: Home / Self Care | Attending: Obstetrics & Gynecology

## 2021-12-15 ENCOUNTER — Other Ambulatory Visit: Payer: Self-pay

## 2021-12-15 ENCOUNTER — Ambulatory Visit (HOSPITAL_BASED_OUTPATIENT_CLINIC_OR_DEPARTMENT_OTHER): Payer: 59 | Admitting: Anesthesiology

## 2021-12-15 ENCOUNTER — Ambulatory Visit (HOSPITAL_COMMUNITY): Payer: 59 | Admitting: Anesthesiology

## 2021-12-15 DIAGNOSIS — Z6841 Body Mass Index (BMI) 40.0 and over, adult: Secondary | ICD-10-CM | POA: Insufficient documentation

## 2021-12-15 DIAGNOSIS — T8332XD Displacement of intrauterine contraceptive device, subsequent encounter: Secondary | ICD-10-CM

## 2021-12-15 DIAGNOSIS — Z7984 Long term (current) use of oral hypoglycemic drugs: Secondary | ICD-10-CM

## 2021-12-15 DIAGNOSIS — Z30432 Encounter for removal of intrauterine contraceptive device: Secondary | ICD-10-CM

## 2021-12-15 DIAGNOSIS — E119 Type 2 diabetes mellitus without complications: Secondary | ICD-10-CM | POA: Insufficient documentation

## 2021-12-15 DIAGNOSIS — T8332XA Displacement of intrauterine contraceptive device, initial encounter: Secondary | ICD-10-CM | POA: Diagnosis not present

## 2021-12-15 DIAGNOSIS — Z79899 Other long term (current) drug therapy: Secondary | ICD-10-CM | POA: Insufficient documentation

## 2021-12-15 DIAGNOSIS — I10 Essential (primary) hypertension: Secondary | ICD-10-CM

## 2021-12-15 DIAGNOSIS — Y831 Surgical operation with implant of artificial internal device as the cause of abnormal reaction of the patient, or of later complication, without mention of misadventure at the time of the procedure: Secondary | ICD-10-CM | POA: Diagnosis not present

## 2021-12-15 HISTORY — DX: Leiomyoma of uterus, unspecified: D25.9

## 2021-12-15 HISTORY — DX: Anemia, unspecified: D64.9

## 2021-12-15 HISTORY — DX: Panic disorder (episodic paroxysmal anxiety): F41.0

## 2021-12-15 HISTORY — PX: LAPAROSCOPY: SHX197

## 2021-12-15 HISTORY — DX: Personal history of other specified conditions: Z87.898

## 2021-12-15 LAB — CBC
HCT: 40.5 % (ref 36.0–46.0)
Hemoglobin: 13.5 g/dL (ref 12.0–15.0)
MCH: 28.8 pg (ref 26.0–34.0)
MCHC: 33.3 g/dL (ref 30.0–36.0)
MCV: 86.5 fL (ref 80.0–100.0)
Platelets: 262 10*3/uL (ref 150–400)
RBC: 4.68 MIL/uL (ref 3.87–5.11)
RDW: 12.9 % (ref 11.5–15.5)
WBC: 7 10*3/uL (ref 4.0–10.5)
nRBC: 0 % (ref 0.0–0.2)

## 2021-12-15 LAB — BASIC METABOLIC PANEL
Anion gap: 9 (ref 5–15)
BUN: 7 mg/dL (ref 6–20)
CO2: 23 mmol/L (ref 22–32)
Calcium: 8.8 mg/dL — ABNORMAL LOW (ref 8.9–10.3)
Chloride: 107 mmol/L (ref 98–111)
Creatinine, Ser: 0.84 mg/dL (ref 0.44–1.00)
GFR, Estimated: >60 mL/min (ref >60)
Glucose, Bld: 124 mg/dL — ABNORMAL HIGH (ref 70–99)
Potassium: 3.7 mmol/L (ref 3.5–5.1)
Sodium: 139 mmol/L (ref 135–145)

## 2021-12-15 LAB — GLUCOSE, CAPILLARY
Glucose-Capillary: 123 mg/dL — ABNORMAL HIGH (ref 70–99)
Glucose-Capillary: 107 mg/dL — ABNORMAL HIGH (ref 70–99)

## 2021-12-15 LAB — NO BLOOD PRODUCTS

## 2021-12-15 SURGERY — LAPAROSCOPY, DIAGNOSTIC
Anesthesia: General

## 2021-12-15 MED ORDER — PROPOFOL 10 MG/ML IV BOLUS
INTRAVENOUS | Status: AC
  Filled 2021-12-15: qty 20

## 2021-12-15 MED ORDER — OXYCODONE HCL 5 MG PO TABS: 5.0000 mg | ORAL_TABLET | Freq: Once | ORAL | Status: DC | PRN

## 2021-12-15 MED ORDER — ACETAMINOPHEN 500 MG PO TABS: 1000.0000 mg | ORAL_TABLET | Freq: Once | ORAL | Status: AC

## 2021-12-15 MED ORDER — FENTANYL CITRATE (PF) 100 MCG/2ML IJ SOLN: 25.0000 ug | INTRAMUSCULAR | Status: DC | PRN

## 2021-12-15 MED ORDER — LIDOCAINE 2% (20 MG/ML) 5 ML SYRINGE
INTRAMUSCULAR | Status: DC | PRN
  Administered 2021-12-15: 100 mg via INTRAVENOUS

## 2021-12-15 MED ORDER — ROCURONIUM BROMIDE 10 MG/ML (PF) SYRINGE
PREFILLED_SYRINGE | INTRAVENOUS | Status: AC
  Filled 2021-12-15: qty 10

## 2021-12-15 MED ORDER — ONDANSETRON HCL 4 MG/2ML IJ SOLN
INTRAMUSCULAR | Status: DC | PRN
  Administered 2021-12-15: 4 mg via INTRAVENOUS

## 2021-12-15 MED ORDER — ROCURONIUM BROMIDE 10 MG/ML (PF) SYRINGE
PREFILLED_SYRINGE | INTRAVENOUS | Status: DC | PRN
  Administered 2021-12-15: 60 mg via INTRAVENOUS

## 2021-12-15 MED ORDER — AMISULPRIDE (ANTIEMETIC) 5 MG/2ML IV SOLN: 10.0000 mg | Freq: Once | INTRAVENOUS | Status: DC | PRN

## 2021-12-15 MED ORDER — MIDAZOLAM HCL 2 MG/2ML IJ SOLN
INTRAMUSCULAR | Status: AC
  Filled 2021-12-15: qty 2

## 2021-12-15 MED ORDER — ONDANSETRON HCL 4 MG/2ML IJ SOLN: 4.0000 mg | Freq: Once | INTRAMUSCULAR | Status: DC | PRN

## 2021-12-15 MED ORDER — SUCCINYLCHOLINE CHLORIDE 200 MG/10ML IV SOSY
PREFILLED_SYRINGE | INTRAVENOUS | Status: AC
Start: 2021-12-15 — End: ?
  Filled 2021-12-15: qty 10

## 2021-12-15 MED ORDER — CHLORHEXIDINE GLUCONATE 0.12 % MT SOLN
15.0000 mL | Freq: Once | OROMUCOSAL | Status: AC
  Administered 2021-12-15: 15 mL via OROMUCOSAL
  Filled 2021-12-15: qty 15

## 2021-12-15 MED ORDER — ONDANSETRON HCL 4 MG/2ML IJ SOLN
INTRAMUSCULAR | Status: AC
  Filled 2021-12-15: qty 2

## 2021-12-15 MED ORDER — FENTANYL CITRATE (PF) 250 MCG/5ML IJ SOLN
INTRAMUSCULAR | Status: AC
  Filled 2021-12-15: qty 5

## 2021-12-15 MED ORDER — SUGAMMADEX SODIUM 200 MG/2ML IV SOLN
INTRAVENOUS | Status: DC | PRN
  Administered 2021-12-15 (×2): 200 mg via INTRAVENOUS

## 2021-12-15 MED ORDER — MIDAZOLAM HCL 2 MG/2ML IJ SOLN
INTRAMUSCULAR | Status: DC | PRN
  Administered 2021-12-15: 2 mg via INTRAVENOUS

## 2021-12-15 MED ORDER — PHENYLEPHRINE 80 MCG/ML (10ML) SYRINGE FOR IV PUSH (FOR BLOOD PRESSURE SUPPORT)
PREFILLED_SYRINGE | INTRAVENOUS | Status: AC
  Filled 2021-12-15: qty 10

## 2021-12-15 MED ORDER — LIDOCAINE 2% (20 MG/ML) 5 ML SYRINGE
INTRAMUSCULAR | Status: AC
  Filled 2021-12-15: qty 5

## 2021-12-15 MED ORDER — PHENYLEPHRINE 80 MCG/ML (10ML) SYRINGE FOR IV PUSH (FOR BLOOD PRESSURE SUPPORT)
PREFILLED_SYRINGE | INTRAVENOUS | Status: DC | PRN
  Administered 2021-12-15: 80 ug via INTRAVENOUS
  Administered 2021-12-15: 160 ug via INTRAVENOUS
  Administered 2021-12-15: 80 ug via INTRAVENOUS

## 2021-12-15 MED ORDER — SUCCINYLCHOLINE CHLORIDE 200 MG/10ML IV SOSY
PREFILLED_SYRINGE | INTRAVENOUS | Status: DC | PRN
  Administered 2021-12-15 (×2): 180 mg via INTRAVENOUS

## 2021-12-15 MED ORDER — LACTATED RINGERS IV SOLN: INTRAVENOUS | Status: DC

## 2021-12-15 MED ORDER — OXYCODONE HCL 5 MG/5ML PO SOLN: 5.0000 mg | Freq: Once | ORAL | Status: DC | PRN

## 2021-12-15 MED ORDER — FENTANYL CITRATE (PF) 250 MCG/5ML IJ SOLN
INTRAMUSCULAR | Status: DC | PRN
Start: 2021-12-15 — End: 2021-12-15
  Administered 2021-12-15: 100 ug via INTRAVENOUS

## 2021-12-15 MED ORDER — DEXAMETHASONE SODIUM PHOSPHATE 10 MG/ML IJ SOLN
INTRAMUSCULAR | Status: AC
  Filled 2021-12-15: qty 1

## 2021-12-15 MED ORDER — ORAL CARE MOUTH RINSE: 15.0000 mL | Freq: Once | OROMUCOSAL | Status: AC

## 2021-12-15 MED ORDER — BUPIVACAINE HCL 0.5 % IJ SOLN
INTRAMUSCULAR | Status: AC
  Filled 2021-12-15: qty 1

## 2021-12-15 MED ORDER — DEXAMETHASONE SODIUM PHOSPHATE 10 MG/ML IJ SOLN
INTRAMUSCULAR | Status: DC | PRN
  Administered 2021-12-15: 5 mg via INTRAVENOUS

## 2021-12-15 MED ORDER — ACETAMINOPHEN 500 MG PO TABS
ORAL_TABLET | ORAL | Status: AC
  Administered 2021-12-15: 1000 mg via ORAL
  Filled 2021-12-15: qty 2

## 2021-12-15 MED ORDER — PROPOFOL 10 MG/ML IV BOLUS
INTRAVENOUS | Status: DC | PRN
  Administered 2021-12-15: 50 mg via INTRAVENOUS
  Administered 2021-12-15: 300 mg via INTRAVENOUS

## 2021-12-15 SURGICAL SUPPLY — 36 items
BAG SPEC RTRVL LRG 6X4 10 (ENDOMECHANICALS)
CABLE HIGH FREQUENCY MONO STRZ (ELECTRODE) IMPLANT
CATH ROBINSON RED A/P 16FR (CATHETERS) IMPLANT
DRSG OPSITE POSTOP 3X4 (GAUZE/BANDAGES/DRESSINGS) IMPLANT
DURAPREP 26ML APPLICATOR (WOUND CARE) ×2 IMPLANT
GLOVE BIO SURGEON STRL SZ7 (GLOVE) ×2 IMPLANT
GLOVE BIOGEL PI IND STRL 7.0 (GLOVE) ×4 IMPLANT
GLOVE BIOGEL PI INDICATOR 7.0 (GLOVE) ×4
GOWN STRL REUS W/ TWL LRG LVL3 (GOWN DISPOSABLE) ×2 IMPLANT
GOWN STRL REUS W/ TWL XL LVL3 (GOWN DISPOSABLE) ×1 IMPLANT
GOWN STRL REUS W/TWL LRG LVL3 (GOWN DISPOSABLE) ×4
GOWN STRL REUS W/TWL XL LVL3 (GOWN DISPOSABLE) ×2
IRRIG SUCT STRYKERFLOW 2 WTIP (MISCELLANEOUS)
IRRIGATION SUCT STRKRFLW 2 WTP (MISCELLANEOUS) IMPLANT
KIT TURNOVER KIT B (KITS) ×2 IMPLANT
MANIPULATOR UTERINE 4.5 ZUMI (MISCELLANEOUS) ×2 IMPLANT
NDL INSUFFLATION 14GA 120MM (NEEDLE) ×1 IMPLANT
NEEDLE INSUFFLATION 14GA 120MM (NEEDLE) ×2 IMPLANT
NS IRRIG 1000ML POUR BTL (IV SOLUTION) ×2 IMPLANT
PACK LAPAROSCOPY BASIN (CUSTOM PROCEDURE TRAY) ×2 IMPLANT
PACK TRENDGUARD 450 HYBRID PRO (MISCELLANEOUS) IMPLANT
POUCH SPECIMEN RETRIEVAL 10MM (ENDOMECHANICALS) IMPLANT
PROTECTOR NERVE ULNAR (MISCELLANEOUS) ×4 IMPLANT
SET TUBE SMOKE EVAC HIGH FLOW (TUBING) ×2 IMPLANT
SHEARS HARMONIC ACE PLUS 36CM (ENDOMECHANICALS) IMPLANT
SLEEVE ENDOPATH XCEL 5M (ENDOMECHANICALS) ×2 IMPLANT
SUT VIC AB 3-0 X1 27 (SUTURE) ×2 IMPLANT
SUT VICRYL 0 UR6 27IN ABS (SUTURE) ×4 IMPLANT
SUT VICRYL 4-0 PS2 18IN ABS (SUTURE) ×2 IMPLANT
SYR 10ML LL (SYRINGE) ×2 IMPLANT
TOWEL GREEN STERILE FF (TOWEL DISPOSABLE) ×4 IMPLANT
TRAY FOLEY W/BAG SLVR 14FR (SET/KITS/TRAYS/PACK) ×2 IMPLANT
TRENDGUARD 450 HYBRID PRO PACK (MISCELLANEOUS)
TROCAR OPTI TIP 5M 100M (ENDOMECHANICALS) ×2 IMPLANT
TROCAR XCEL DIL TIP R 11M (ENDOMECHANICALS) ×2 IMPLANT
TROCAR XCEL NON-BLD 5MMX100MML (ENDOMECHANICALS) ×2 IMPLANT

## 2021-12-15 NOTE — Anesthesia Preprocedure Evaluation (Signed)
Anesthesia Evaluation  Patient identified by MRN, date of birth, ID band Patient awake    Reviewed: Allergy & Precautions, NPO status , Patient's Chart, lab work & pertinent test results  History of Anesthesia Complications Negative for: history of anesthetic complications  Airway Mallampati: I  TM Distance: >3 FB Neck ROM: Full    Dental  (+) Dental Advisory Given, Teeth Intact   Pulmonary neg pulmonary ROS,    Pulmonary exam normal        Cardiovascular hypertension, Pt. on medications and Pt. on home beta blockers Normal cardiovascular exam     Neuro/Psych Anxiety negative neurological ROS     GI/Hepatic negative GI ROS, Neg liver ROS,   Endo/Other  diabetes, Type 2, Oral Hypoglycemic AgentsMorbid obesity  Renal/GU negative Renal ROS  negative genitourinary   Musculoskeletal negative musculoskeletal ROS (+)   Abdominal   Peds  Hematology negative hematology ROS (+)   Anesthesia Other Findings Ozempic, last dose 8/11, currently asymptomatic  Reproductive/Obstetrics Displaced IUD                             Anesthesia Physical Anesthesia Plan  ASA: 3  Anesthesia Plan: General   Post-op Pain Management: Tylenol PO (pre-op)* and Toradol IV (intra-op)*   Induction: Intravenous and Rapid sequence  PONV Risk Score and Plan: 4 or greater and Ondansetron, Dexamethasone, Treatment may vary due to age or medical condition and Midazolam  Airway Management Planned: Oral ETT  Additional Equipment: None  Intra-op Plan:   Post-operative Plan: Extubation in OR  Informed Consent: I have reviewed the patients History and Physical, chart, labs and discussed the procedure including the risks, benefits and alternatives for the proposed anesthesia with the patient or authorized representative who has indicated his/her understanding and acceptance.     Dental advisory given  Plan Discussed  with:   Anesthesia Plan Comments:         Anesthesia Quick Evaluation

## 2021-12-15 NOTE — Anesthesia Postprocedure Evaluation (Signed)
Anesthesia Post Note  Patient: Marissa Barrera  Procedure(s) Performed: REMOVAL OF TRANSVAGINAL  IUD     Patient location during evaluation: PACU Anesthesia Type: General Level of consciousness: awake and alert Pain management: pain level controlled Vital Signs Assessment: post-procedure vital signs reviewed and stable Respiratory status: spontaneous breathing, nonlabored ventilation and respiratory function stable Cardiovascular status: blood pressure returned to baseline and stable Postop Assessment: no apparent nausea or vomiting Anesthetic complications: no   No notable events documented.  Last Vitals:  Vitals:   12/15/21 1045 12/15/21 1100  BP: 118/84 (!) 114/92  Pulse: 73 72  Resp: 11 (!) 22  Temp:  36.5 C  SpO2: 93% 95%    Last Pain:  Vitals:   12/15/21 1100  TempSrc:   PainSc: 0-No pain                 Lidia Collum

## 2021-12-15 NOTE — Transfer of Care (Signed)
Immediate Anesthesia Transfer of Care Note  Patient: Marissa Barrera  Procedure(s) Performed: REMOVAL OF TRANSVAGINAL  IUD  Patient Location: PACU  Anesthesia Type:General  Level of Consciousness: awake, alert , patient cooperative and responds to stimulation  Airway & Oxygen Therapy: Patient Spontanous Breathing and Patient connected to nasal cannula oxygen  Post-op Assessment: Report given to RN, Post -op Vital signs reviewed and stable and Patient moving all extremities X 4  Post vital signs: Reviewed and stable  Last Vitals:  Vitals Value Taken Time  BP    Temp    Pulse    Resp    SpO2      Last Pain:  Vitals:   12/15/21 0822  TempSrc:   PainSc: 0-No pain         Complications: No notable events documented.

## 2021-12-15 NOTE — Brief Op Note (Signed)
12/15/2021  10:53 AM  PATIENT:  Marissa Barrera  56 y.o. female  PRE-OPERATIVE DIAGNOSIS:  Displaced IUD  POST-OPERATIVE DIAGNOSIS:  Displaced IUD  PROCEDURE:  Procedure(s): REMOVAL OF TRANSVAGINAL  IUD (N/A)  SURGEON:  Surgeon(s) and Role:    * Lavonia Drafts, MD - Primary  ANESTHESIA:   general  EBL:  0 mL   BLOOD ADMINISTERED:none  DRAINS: none   LOCAL MEDICATIONS USED:  NONE  SPECIMEN:  Source of Specimen:  IUD  DISPOSITION OF SPECIMEN:  PATHOLOGY  COUNTS:  YES  TOURNIQUET:  * No tourniquets in log *  DICTATION: .Note written in [place]: Discharge to home after PACU  PATIENT DISPOSITION:  PACU - hemodynamically stable.   Delay start of Pharmacological VTE agent (>24hrs) due to surgical blood loss or risk of bleeding: not applicable  Complications: none immediate  [name], M.D., Cherlynn June

## 2021-12-15 NOTE — H&P (Signed)
Preoperative History and Physical  Marissa Barrera is a 56 y.o. G0P0000 here for surgical management of IUD migration.    Proposed surgery: laparoscopy with retrieval of lost IUD  Past Medical History:  Diagnosis Date   Anemia    COVID 01/2021   very mild case   Diabetes type 2, controlled ([place]) 06/27/2014   History of palpitations    due to stress   Hyperglycemia 01/14/2014   Hypertension    Morbid obesity (HCC)    Panic attacks    no treatment, calms herself   Uterine fibroid    Past Surgical History:  Procedure Laterality Date   CHOLECYSTECTOMY  05/04/1996   FOOT SURGERY Bilateral 05/04/1988   pinched nerve   GASTRIC BYPASS  05/05/1995   HYSTEROSCOPY  03/2020   OB History     Gravida  0   Para  0   Term  0   Preterm  0   AB  0   Living  0      SAB  0   IAB  0   Ectopic  0   Multiple  0   Live Births  0          Patient denies any cervical dysplasia or STIs. Medications Prior to Admission  Medication Sig Dispense Refill Last Dose   atorvastatin (LIPITOR) 20 MG tablet Take 1 tablet (20 mg total) by mouth 3 (three) times a week. 45 tablet 2 12/12/2021   metFORMIN (GLUCOPHAGE-XR) 500 MG 24 hr tablet Take 500 mg by mouth every evening.   12/14/2021   metoprolol (TOPROL-XL) 200 MG 24 hr tablet Take 1 tablet (200 mg total) by mouth daily. Take with or immediately following a meal. 90 tablet 3    Semaglutide, 2 MG/DOSE, (OZEMPIC, 2 MG/DOSE,) 8 MG/3ML SOPN Inject 2 mg into the skin every Wednesday.   12/12/2021    Allergies  Allergen Reactions   Ace Inhibitors Cough    Cough    Penicillins Hives   Social History:   reports that she has never smoked. She has never used smokeless tobacco. She reports current alcohol use of about 3.0 standard drinks of alcohol per week. She reports that she does not use drugs. Family History  Problem Relation Age of Onset   Arthritis Mother    Hypertension Father    Diabetes Father    Parkinson's disease Father     Cancer Maternal Aunt        breast, ovarian, colon   Cancer Maternal Grandmother        ovarian   Heart attack Maternal Grandfather    Parkinson's disease Paternal Grandmother    Heart attack Paternal Grandfather     Review of Systems: Noncontributory  PHYSICAL EXAM: Blood pressure (!) 146/93, pulse 79, temperature 97.6 F (36.4 C), temperature source Oral, resp. rate 18, height '5\' 6"'$  (1.676 m), weight (!) 152 kg, last menstrual period 06/08/2016, SpO2 98 %. General appearance - alert, well appearing, and in no distress Chest - clear to auscultation, no wheezes, rales or rhonchi, symmetric air entry Heart - normal rate and regular rhythm Abdomen - soft, nontender, nondistended, no masses or organomegaly Pelvic - examination not indicated Extremities - peripheral pulses normal, no pedal edema, no clubbing or cyanosis  Labs: Results for orders placed or performed during the hospital encounter of 12/15/21 (from the past 336 hour(s))  Glucose, capillary   Collection Time: 12/15/21  7:55 AM  Result Value Ref Range   Glucose-Capillary 123 (H)  70 - 99 mg/dL  CBC per protocol   Collection Time: 12/15/21  8:20 AM  Result Value Ref Range   WBC 7.0 4.0 - 10.5 K/uL   RBC 4.68 3.87 - 5.11 MIL/uL   Hemoglobin 13.5 12.0 - 15.0 g/dL   HCT 40.5 36.0 - 46.0 %   MCV 86.5 80.0 - 100.0 fL   MCH 28.8 26.0 - 34.0 pg   MCHC 33.3 30.0 - 36.0 g/dL   RDW 12.9 11.5 - 15.5 %   Platelets 262 150 - 400 K/uL   nRBC 0.0 0.0 - 0.2 %  Basic metabolic panel per protocol   Collection Time: 12/15/21  8:20 AM  Result Value Ref Range   Sodium 139 135 - 145 mmol/L   Potassium 3.7 3.5 - 5.1 mmol/L   Chloride 107 98 - 111 mmol/L   CO2 23 22 - 32 mmol/L   Glucose, Bld 124 (H) 70 - 99 mg/dL   BUN 7 6 - 20 mg/dL   Creatinine, Ser 0.84 0.44 - 1.00 mg/dL   Calcium 8.8 (L) 8.9 - 10.3 mg/dL   GFR, Estimated >60 >60 mL/min   Anion gap 9 5 - 15    Imaging Studies: No results found.  Assessment: Patient Active  Problem List   Diagnosis Date Noted   Mixed hyperlipidemia 12/02/2020   Fibroids, submucosal 06/17/2020   Abnormal uterine bleeding due to endometrial polyp 06/17/2020   Refusal of blood transfusions as patient is Jehovah's Witness 02/14/2020   PMB (postmenopausal bleeding) 02/05/2020   Right foot pain 02/10/2018   Vitamin B12 deficiency 11/11/2017   Controlled type 2 diabetes mellitus without complication, without long-term current use of insulin ([place]) 06/27/2014   Anemia 06/27/2014   Palpitations 06/27/2014   Morbid obesity ([place]) 01/12/2014   Essential hypertension 01/12/2014    Plan: Patient will undergo surgical management with laparoscopy with retrieval of lost IUD.   The risks of surgery were discussed in detail with the patient including but not limited to: bleeding which may require transfusion or reoperation; infection which may require antibiotics; injury to surrounding organs which may involve bowel, bladder, ureters ; need for additional procedures including laparoscopy or laparotomy; thromboembolic phenomenon, surgical site problems and other postoperative/anesthesia complications. Likelihood of success in alleviating the patient's condition was discussed. Routine postoperative instructions will be reviewed with the patient and her family in detail after surgery.  The patient concurred with the proposed plan, giving informed written consent for the surgery.  Patient has been NPO since last night she will remain NPO for procedure.  Anesthesia and OR aware.  Preoperative prophylactic antibiotics and SCDs ordered on call to the OR.  To OR when ready.  Farrah Skoda L. Ihor Dow, M.D., [facility] 12/15/2021 9:11 AM

## 2021-12-15 NOTE — Op Note (Signed)
12/15/2021  10:53 AM  PATIENT:  Marissa Barrera  56 y.o. female  PRE-OPERATIVE DIAGNOSIS:  Displaced IUD  POST-OPERATIVE DIAGNOSIS:  Displaced IUD  PROCEDURE:  Procedure(s): REMOVAL OF TRANSVAGINAL  IUD (N/A)  SURGEON:  Surgeon(s) and Role:    * Lavonia Drafts, MD - Primary  ANESTHESIA:   general  EBL:  0 mL   BLOOD ADMINISTERED:none  DRAINS: none   LOCAL MEDICATIONS USED:  NONE  SPECIMEN:  Source of Specimen:  IUD  DISPOSITION OF SPECIMEN:  PATHOLOGY  COUNTS:  YES  TOURNIQUET:  * No tourniquets in log *  DICTATION: .Note written in [place]: Discharge to home after PACU  PATIENT DISPOSITION:  PACU - hemodynamically stable.   Delay start of Pharmacological VTE agent (>24hrs) due to surgical blood loss or risk of bleeding: not applicable  Complications: none immediate  Preop: Pt with visit for AUB. IUD strings not noted. Xrays revealed that the IUD was near the left sacroiliac joint. Could not determine whether IUD was in pelvic or uterus. After discussion with patient a decision was made to proceed to the OR for removal of presumed intraperitoneal IUD.    Procedure:  After general anesthesia was performed, patient was placed in the dorsal lithotomy position and prepped and draped in the usual sterile fashion.  A bivalved speculum was placed in the patient's vagina, normal discharge was noted, no lesions. The cervix was identified and as I was preparing to place a single toothed tenaculum, the IUD strings were noted deep in the pelvis. The strings of the IUD were grasped and pulled using ring forceps.  The IUD was successfully removed in its entirety.  Patient tolerated the procedure well.   She was awakened from general anesthesia and taken to PACU. There were no complications.   Patient will be discharged to home from the PACU.    Mc Bloodworth L. Harraway-Smith, M.D., [facility]

## 2021-12-15 NOTE — Anesthesia Procedure Notes (Signed)
Procedure Name: Intubation Date/Time: 12/15/2021 9:51 AM  Performed by: Michele Rockers, CRNAPre-anesthesia Checklist: Patient identified, Patient being monitored, Timeout performed, Emergency Drugs available and Suction available Patient Re-evaluated:Patient Re-evaluated prior to induction Oxygen Delivery Method: Circle System Utilized Preoxygenation: Pre-oxygenation with 100% oxygen Induction Type: IV induction Ventilation: Mask ventilation without difficulty Laryngoscope Size: Miller and 2 Grade View: Grade I Tube type: Oral Tube size: 7.0 mm Number of attempts: 1 Airway Equipment and Method: Stylet Placement Confirmation: ETT inserted through vocal cords under direct vision, positive ETCO2 and breath sounds checked- equal and bilateral Secured at: 21 cm Tube secured with: Tape Dental Injury: Teeth and Oropharynx as per pre-operative assessment

## 2021-12-16 ENCOUNTER — Encounter (HOSPITAL_COMMUNITY): Payer: Self-pay | Admitting: Obstetrics & Gynecology

## 2021-12-17 ENCOUNTER — Ambulatory Visit (INDEPENDENT_AMBULATORY_CARE_PROVIDER_SITE_OTHER): Payer: 59 | Admitting: Nurse Practitioner

## 2021-12-17 ENCOUNTER — Encounter: Payer: Self-pay | Admitting: Nurse Practitioner

## 2021-12-17 VITALS — BP 133/84 | HR 76 | Temp 97.3°F | Ht 66.0 in | Wt 344.4 lb

## 2021-12-17 DIAGNOSIS — I10 Essential (primary) hypertension: Secondary | ICD-10-CM | POA: Diagnosis not present

## 2021-12-17 DIAGNOSIS — E1169 Type 2 diabetes mellitus with other specified complication: Secondary | ICD-10-CM | POA: Diagnosis not present

## 2021-12-17 DIAGNOSIS — E785 Hyperlipidemia, unspecified: Secondary | ICD-10-CM

## 2021-12-17 DIAGNOSIS — E119 Type 2 diabetes mellitus without complications: Secondary | ICD-10-CM

## 2021-12-17 LAB — MICROALBUMIN / CREATININE URINE RATIO
Creatinine,U: 162.5 mg/dL
Microalb Creat Ratio: 1.8 mg/g (ref 0.0–30.0)
Microalb, Ur: 2.9 mg/dL — ABNORMAL HIGH (ref 0.0–1.9)

## 2021-12-17 LAB — POCT GLYCOSYLATED HEMOGLOBIN (HGB A1C)
HbA1c POC (<> result, manual entry): 6.1 % (ref 4.0–5.6)
HbA1c, POC (controlled diabetic range): 6.1 % (ref 0.0–7.0)
HbA1c, POC (prediabetic range): 6.1 % (ref 5.7–6.4)
Hemoglobin A1C: 6.1 % — AB (ref 4.0–5.6)

## 2021-12-17 MED ORDER — METFORMIN HCL ER 500 MG PO TB24: 500.0000 mg | ORAL_TABLET | Freq: Every evening | ORAL | 1 refills | Status: DC

## 2021-12-17 MED ORDER — ATORVASTATIN CALCIUM 20 MG PO TABS: 20.0000 mg | ORAL_TABLET | ORAL | 3 refills | Status: AC

## 2021-12-17 MED ORDER — METOPROLOL SUCCINATE ER 200 MG PO TB24: 200.0000 mg | ORAL_TABLET | Freq: Every day | ORAL | 3 refills | Status: AC

## 2021-12-17 MED ORDER — OZEMPIC (2 MG/DOSE) 8 MG/3ML ~~LOC~~ SOPN: 2.0000 mg | PEN_INJECTOR | SUBCUTANEOUS | 1 refills | Status: AC

## 2021-12-17 NOTE — Progress Notes (Signed)
Established Patient Visit  Patient: Marissa Barrera   DOB: 1966/03/18   56 y.o. Female  MRN: [id number] Visit Date: 12/17/2021  Subjective:    Chief Complaint  Patient presents with   Medication Refill    Pt here for med refill no other concerns.   HPI Controlled type 2 diabetes mellitus without complication, without long-term current use of insulin ([place]) Controlled with repeat hgbA1c at 6.1% Denies any adverse effects with ozempic and metformin Has not completed eye exam due to poor insurance coverage. No neuropathy. BP at goal LDL at goal with atorvastatin  Check urine microalbumin today Maintain med doses, refill sent. F/up in 40month  Reviewed medical, surgical, and social history today  Medications: Outpatient Medications Prior to Visit  Medication Sig   [DISCONTINUED] atorvastatin (LIPITOR) 20 MG tablet Take 1 tablet (20 mg total) by mouth 3 (three) times a week.   [DISCONTINUED] metFORMIN (GLUCOPHAGE-XR) 500 MG 24 hr tablet Take 500 mg by mouth every evening.   [DISCONTINUED] metoprolol (TOPROL-XL) 200 MG 24 hr tablet Take 1 tablet (200 mg total) by mouth daily. Take with or immediately following a meal.   [DISCONTINUED] Semaglutide, 2 MG/DOSE, (OZEMPIC, 2 MG/DOSE,) 8 MG/3ML SOPN Inject 2 mg into the skin every Wednesday.   No facility-administered medications prior to visit.   Reviewed past medical and social history.   ROS per HPI above      Objective:  BP 133/84 (BP Location: Right Arm, Patient Position: Sitting, Cuff Size: Large)   Pulse 76   Temp (!) 97.3 F (36.3 C)   Ht '5\' 6"'$  (1.676 m)   Wt (!) 344 lb 6.4 oz (156.2 kg)   LMP 06/08/2016 (Exact Date)   SpO2 100%   BMI 55.59 kg/m      Physical Exam Vitals reviewed.  Constitutional:      Appearance: She is obese.  Cardiovascular:     Rate and Rhythm: Normal rate.     Pulses: Normal pulses.  Pulmonary:     Effort: Pulmonary effort is normal.  Musculoskeletal:     Right lower  leg: No edema.     Left lower leg: No edema.  Neurological:     Mental Status: She is alert and oriented to person, place, and time.     Results for orders placed or performed in visit on 12/17/21  Microalbumin / creatinine urine ratio  Result Value Ref Range   Microalb, Ur 2.9 (H) 0.0 - 1.9 mg/dL   Creatinine,U 162.5 mg/dL   Microalb Creat Ratio 1.8 0.0 - 30.0 mg/g  POCT glycosylated hemoglobin (Hb A1C)  Result Value Ref Range   Hemoglobin A1C 6.1 (A) 4.0 - 5.6 %   HbA1c POC (<> result, manual entry) 6.1 4.0 - 5.6 %   HbA1c, POC (prediabetic range) 6.1 5.7 - 6.4 %   HbA1c, POC (controlled diabetic range) 6.1 0.0 - 7.0 %      Assessment & Plan:    Problem List Items Addressed This Visit       Cardiovascular and Mediastinum   Essential hypertension   Relevant Medications   metoprolol (TOPROL-XL) 200 MG 24 hr tablet   atorvastatin (LIPITOR) 20 MG tablet     Endocrine   Controlled type 2 diabetes mellitus without complication, without long-term current use of insulin (HCC) - Primary    Controlled with repeat hgbA1c at 6.1% Denies any adverse effects with ozempic and metformin Has  not completed eye exam due to poor insurance coverage. No neuropathy. BP at goal LDL at goal with atorvastatin  Check urine microalbumin today Maintain med doses, refill sent. F/up in 81month      Relevant Medications   Semaglutide, 2 MG/DOSE, (OZEMPIC, 2 MG/DOSE,) 8 MG/3ML SOPN   atorvastatin (LIPITOR) 20 MG tablet   metFORMIN (GLUCOPHAGE-XR) 500 MG 24 hr tablet   Other Relevant Orders   POCT glycosylated hemoglobin (Hb A1C) (Completed)   Microalbumin / creatinine urine ratio (Completed)   Other Visit Diagnoses     Hyperlipidemia associated with type 2 diabetes mellitus (HCC)       Relevant Medications   Semaglutide, 2 MG/DOSE, (OZEMPIC, 2 MG/DOSE,) 8 MG/3ML SOPN   metoprolol (TOPROL-XL) 200 MG 24 hr tablet   atorvastatin (LIPITOR) 20 MG tablet   metFORMIN (GLUCOPHAGE-XR) 500 MG 24  hr tablet      Return in about 3 months (around 03/19/2022) for CPE (fasting).     CWilfred Lacy NP

## 2021-12-17 NOTE — Patient Instructions (Signed)
hgbA1c at 6.1% Maintain ozempic  and metformin dose.

## 2021-12-17 NOTE — Assessment & Plan Note (Signed)
Controlled with repeat hgbA1c at 6.1% Denies any adverse effects with ozempic and metformin Has not completed eye exam due to poor insurance coverage. No neuropathy. BP at goal LDL at goal with atorvastatin  Check urine microalbumin today Maintain med doses, refill sent. F/up in 7month

## 2021-12-18 ENCOUNTER — Encounter: Payer: Self-pay | Admitting: Obstetrics & Gynecology

## 2021-12-23 ENCOUNTER — Ambulatory Visit: Payer: 59 | Admitting: Nurse Practitioner

## 2022-01-07 ENCOUNTER — Encounter: Payer: Self-pay | Admitting: Obstetrics & Gynecology

## 2022-01-07 ENCOUNTER — Ambulatory Visit (INDEPENDENT_AMBULATORY_CARE_PROVIDER_SITE_OTHER): Payer: Commercial Managed Care - HMO | Admitting: Obstetrics & Gynecology

## 2022-01-07 VITALS — BP 143/74 | HR 78 | Wt 339.0 lb

## 2022-01-07 DIAGNOSIS — N939 Abnormal uterine and vaginal bleeding, unspecified: Secondary | ICD-10-CM

## 2022-01-07 DIAGNOSIS — N950 Postmenopausal bleeding: Secondary | ICD-10-CM | POA: Diagnosis not present

## 2022-01-07 NOTE — Progress Notes (Signed)
Patient is postop from displaced IUD. Surgery on 12/15/21 [facility]

## 2022-01-07 NOTE — Progress Notes (Signed)
History:  56 y.o. G0P0000 here today for eval of AUB. Pt was posted for a lap with IUD removal. The IUD was located in the uterus prior to the procedure when placing the Uterine manipulator. Pt cont to be concerned about the bleeding. She first noted it post Covid and had a hysteroscopy with removal of polyps and placement of the IUD. Then the bleeding occurred again and the bx was neg. The IUD strings were not noted and the pt was scheduled for procedure above. Since the IUD was removed, pt reports that she had another heavy cycle. She is frustrated with the bleeding mainly because she has a /o terrible cycles and wants this to end.   The following portions of the patient's history were reviewed and updated as appropriate: allergies, current medications, past family history, past medical history, past social history, past surgical history and problem list.  Review of Systems:  Pertinent items are noted in HPI.    Objective:  Physical Exam Blood pressure (!) 143/74, pulse 78, weight (!) 339 lb (153.8 kg), last menstrual period 06/08/2016.  CONSTITUTIONAL: Well-developed, well-nourished female in no acute distress.  HENT:  Normocephalic, atraumatic EYES: Conjunctivae and EOM are normal. No scleral icterus.  NECK: Normal range of motion SKIN: Skin is warm and dry. No rash noted. Not diaphoretic.No pallor. [place]: Alert and oriented to person, place, and time. Normal coordination.    Labs and Imaging 10/15/2021 FINAL MICROSCOPIC DIAGNOSIS:   A. ENDOMETRIUM, BIOPSY:  Endometrium with changes consistent with prolonged progesterone effect.  Negative for hyperplasia, malignancy,  polyp and endometritis.   Assessment & Plan:  AUB- reviewed potential causes an neg Endo bx. Pt offered Megace. She'd like to wait at present.  Rec f/u in 3 months or sooner prn Pt to keep a log of cycles.   Total face-to-face time with patient, review of chart, discussion with consultant and coordination of care  was 16mn.    Braydyn Schultes L. Harraway-Smith, M.D., [facility]

## 2022-02-09 ENCOUNTER — Telehealth: Payer: Self-pay | Admitting: General Practice

## 2022-02-09 NOTE — Telephone Encounter (Signed)
Left message on VM for pt to contact our office to schedule 3 month follow up (virtual) with Dr. Ihor Dow in December.

## 2022-02-19 ENCOUNTER — Ambulatory Visit: Payer: Commercial Managed Care - HMO | Admitting: Nurse Practitioner

## 2022-04-06 ENCOUNTER — Telehealth: Payer: Commercial Managed Care - HMO | Admitting: Physician Assistant

## 2022-04-06 DIAGNOSIS — T7840XA Allergy, unspecified, initial encounter: Secondary | ICD-10-CM | POA: Diagnosis not present

## 2022-04-06 NOTE — Patient Instructions (Signed)
Verl Blalock, thank you for joining Leeanne Rio, PA-C for today's virtual visit.  While this provider is not your primary care provider (PCP), if your PCP is located in our provider database this encounter information will be shared with them immediately following your visit.   [facility] account gives you access to today's visit and all your visits, tests, and labs performed at [facility] " click here if you don't have a [facility] account or go to [email or web address]  Consent: (Patient) Marissa Barrera provided verbal consent for this virtual visit at the beginning of the encounter.  Current Medications:  Current Outpatient Medications:    atorvastatin (LIPITOR) 20 MG tablet, Take 1 tablet (20 mg total) by mouth 3 (three) times a week., Disp: 36 tablet, Rfl: 3   metFORMIN (GLUCOPHAGE-XR) 500 MG 24 hr tablet, Take 1 tablet (500 mg total) by mouth every evening., Disp: 90 tablet, Rfl: 1   metoprolol (TOPROL-XL) 200 MG 24 hr tablet, Take 1 tablet (200 mg total) by mouth daily. Take with or immediately following a meal., Disp: 90 tablet, Rfl: 3   Semaglutide, 2 MG/DOSE, (OZEMPIC, 2 MG/DOSE,) 8 MG/3ML SOPN, Inject 2 mg into the skin every Wednesday., Disp: 15 mL, Rfl: 1   Medications ordered in this encounter:  No orders of the defined types were placed in this encounter.    *If you need refills on other medications prior to your next appointment, please contact your pharmacy*  Follow-Up: Call back or seek an in-person evaluation if the symptoms worsen or if the condition fails to improve as anticipated.  [place] [phone]  Other Instructions Food Allergy A food allergy is an abnormal reaction to a food (food allergen) by the body's defense system (immune system). Foods that commonly cause allergies include: Milk. Seafood. Eggs. Peanuts. Wheat. Soy. Tree nuts such as pecans, walnuts, and cashews. What are the  causes? Food allergies happen when the immune system sees a food as harmful and releases proteins (antibodies) to fight it. What are the signs or symptoms? Symptoms may be mild or severe. They usually start minutes after eating the food, but they can occur even a few hours later. In people with a severe allergy, symptoms can start within seconds. Mild symptoms of this condition include: Congested nose. Tingling in the mouth. An itchy, red rash. Vomiting. Diarrhea. In people with a severe allergy, a life-threatening reaction can occur called anaphylaxis. Get help right away if you have symptoms of anaphylaxis, such as: Feeling warm in the face (flushed). This may include redness. Itchy, red, swollen areas of skin (hives). Swelling of the eyes, lips, face, mouth, tongue, or throat. Difficulty breathing, speaking, or swallowing. Noisy breathing, high-pitched whistling sounds when you breathe, most often when you breathe out (wheezing). Dizziness, light-headedness, or fainting. Pain or cramping in the abdomen. How is this diagnosed? This condition may be diagnosed based on: A physical exam. Your medical history. Skin tests. Blood tests. A food challenge test. This test involves eating the food that may be causing the allergic response while being monitored for a reaction by your health care provider. The results of an elimination diet. The elimination diet involves removing foods from your diet and then adding them back in, one at a time. A food diary. How is this treated? There is no cure for food allergies. Treatment focuses on preventing exposure to the food or foods you are allergic to and treating reactions if you are  exposed to the food. Mild symptoms may not need treatment.  Severe reactions usually need to be treated at a hospital. Treatment may include: Medicines that help: Tighten your blood vessels (epinephrine). Relieve itching and hives (antihistamines). Widen the narrow and  tight airways (bronchodilators). Reduce swelling (corticosteroids). Oxygen therapy to help you breathe. IV fluids to keep you hydrated. After a severe reaction, you may be given rescue medicines, such as: An anaphylaxis kit. An epinephrine injection, commonly called an auto-injector "pen" (pre-filled automatic epinephrine injection device). Your health care provider may teach you how to use these if you are accidentally exposed to an allergen. Follow these instructions at home: If you have a potential allergy: Follow the elimination diet as told by your health care provider. Keep a food diary as told by your health care provider. Every day, write down: What you eat and drink and when. What symptoms you have and when. If you have a severe allergy:  Wear a medical alert bracelet or necklace that describes your allergy. Carry your anaphylaxis kit or an auto-injector pen with you at all times. Use them as told by your health care provider. Make sure that you, your family members, and your employer know: The signs of anaphylaxis. How to use an anaphylaxis kit. How to use an auto-injector pen. If you think that you are having an anaphylactic reaction, use your auto-injector pen or anaphylaxis kit. Replace your epinephrine immediately after you use your auto-injector pen. This is important if you have another reaction. If possible, carry two epinephrine auto-injector pens. Get medical care after using your auto-injector pen. This is important because you can have a delayed, life-threatening reaction after taking the medicine (rebound anaphylaxis). General instructions Avoid the foods that you are allergic to. Read food labels before you eat packaged items. Look for ingredients that you are allergic to. When you are at a restaurant, tell your server that you have an allergy. If you are unsure whether a meal has an ingredient that you are allergic to, ask your server. Take over-the-counter and  prescription medicines only as told by your health care provider. Do not drive or operate machinery until your health care provider says that it is safe. Inform all of your health care providers that you have a food allergy. Work with your health care providers to make an anaphylaxis plan. Preparation is important. If you think that you might be allergic to something else, talk with your health care provider. Do not eat a food to see if you are allergic to it without talking with your health care provider first. Contact a health care provider if: You have symptoms that do not go away within 2 days. You have symptoms that get worse. You have new symptoms. Get help right away if you have symptoms of anaphylaxis: Flushed skin. Hives. Swelling of the eyes, lips, face, mouth, tongue, or throat. Difficulty breathing, speaking, or swallowing. Wheezing. Dizziness, light-headedness or fainting. Pain or cramping in the abdomen. These symptoms may represent a serious problem that is an emergency. Do not wait to see if the symptoms will go away. Use your auto-injector pen or anaphylaxis kit as you have been told. Get medical help right away. Call your local emergency services (911 in the [place] Do not drive yourself to the hospital. If you needed to use an auto-injector pen, you need more medical care even if the medicine seems to be helping. This is important because anaphylaxis may happen again within 72 hours. Summary A food  allergy is an abnormal reaction to a food (food allergen) by the body's defense system (immune system). There is no cure for food allergies. Treatment focuses on preventing exposure to the food or foods you are allergic to and treating reactions if you are exposed to the food. Wear a medical alert bracelet or necklace that describes your allergy. If you have symptoms of anaphylaxis, use your auto-injector pen or anaphylaxis kit as you have been instructed, and get medical help  right away. This information is not intended to replace advice given to you by your health care provider. Make sure you discuss any questions you have with your health care provider. Document Revised: 08/06/2020 Document Reviewed: 08/06/2020 Elsevier Patient Education  [place].    If you have been instructed to have an in-person evaluation today at a local Urgent Care facility, please use the link below. It will take you to a list of all of our available [facility], including address, phone number and hours of operation. Please do not delay care.  [facility]  If you or a family member do not have a primary care provider, use the link below to schedule a visit and establish care. When you choose a [facility] primary care physician or advanced practice provider, you gain a long-term partner in health. Find a Primary Care Provider  Learn more about [facility]'s in-office and virtual care options: [facility]

## 2022-04-06 NOTE — Progress Notes (Signed)
Virtual Visit Consent   Marissa Barrera, you are scheduled for a virtual visit with a [facility] provider today. Just as with appointments in the office, your consent must be obtained to participate. Your consent will be active for this visit and any virtual visit you may have with one of our providers in the next 365 days. If you have a MyChart account, a copy of this consent can be sent to you electronically.  As this is a virtual visit, video technology does not allow for your provider to perform a traditional examination. This may limit your provider's ability to fully assess your condition. If your provider identifies any concerns that need to be evaluated in person or the need to arrange testing (such as labs, EKG, etc.), we will make arrangements to do so. Although advances in technology are sophisticated, we cannot ensure that it will always work on either your end or our end. If the connection with a video visit is poor, the visit may have to be switched to a telephone visit. With either a video or telephone visit, we are not always able to ensure that we have a secure connection.  By engaging in this virtual visit, you consent to the provision of healthcare and authorize for your insurance to be billed (if applicable) for the services provided during this visit. Depending on your insurance coverage, you may receive a charge related to this service.  I need to obtain your verbal consent now. Are you willing to proceed with your visit today? Marissa Barrera has provided verbal consent on 04/06/2022 for a virtual visit (video or telephone). Leeanne Rio, [place]  Date: 04/06/2022 1:22 PM  Virtual Visit via Video Note   I, Leeanne Rio, connected with  Marissa Barrera  ([id number], 24-Apr-1966) on 04/06/22 at  1:15 PM EST by a video-enabled telemedicine application and verified that I am speaking with the correct person using two identifiers.  Location: Patient: Virtual Visit Location  Patient: Home Provider: Virtual Visit Location Provider: Home Office   I discussed the limitations of evaluation and management by telemedicine and the availability of in person appointments. The patient expressed understanding and agreed to proceed.    History of Present Illness: Marissa Barrera is a 56 y.o. who identifies as a female who was assigned female at birth, and is being seen today for possible allergic reaction. Notes Saturday morning woke up with rash around mouth and mild swelling of the lips. Notes teeth and gums were very sensitive. Later in the day noted some mild but noticeable tongue swelling and mild sore throat. Yesterday worsened with some difficulty swallowing. Denies SOB. Started taking Benadryl OTC -- three doses yesterday  with significant improvement. Notes today symptoms are mostly resolved. Denies any new symptoms. Denies fever, chills. Difficulty swallowing or breathing. Denies change to soaps, lotions, detergents, etc. Ate a new chinese sauce the other day but no other dietary changes. Denies new medications.   HPI: HPI  Problems:  Patient Active Problem List   Diagnosis Date Noted   Mixed hyperlipidemia 12/02/2020   Fibroids, submucosal 06/17/2020   Abnormal uterine bleeding due to endometrial polyp 06/17/2020   Refusal of blood transfusions as patient is [employer]hov[employer]'s Witness 02/14/2020   PMB (postmenopausal bleeding) 02/05/2020   Right foot pain 02/10/2018   Vitamin B12 deficiency 11/11/2017   Controlled type 2 diabetes mellitus without complication, without long-term current use of insulin ([place]) 06/27/2014   Anemia 06/27/2014   Palpitations 06/27/2014  Morbid obesity ([place]) 01/12/2014   Essential hypertension 01/12/2014    Allergies:  Allergies  Allergen Reactions   Ace Inhibitors Cough    Cough    Penicillins Hives   Medications:  Current Outpatient Medications:    atorvastatin (LIPITOR) 20 MG tablet, Take 1 tablet (20 mg total) by mouth 3 (three)  times a week., Disp: 36 tablet, Rfl: 3   metFORMIN (GLUCOPHAGE-XR) 500 MG 24 hr tablet, Take 1 tablet (500 mg total) by mouth every evening., Disp: 90 tablet, Rfl: 1   metoprolol (TOPROL-XL) 200 MG 24 hr tablet, Take 1 tablet (200 mg total) by mouth daily. Take with or immediately following a meal., Disp: 90 tablet, Rfl: 3   Semaglutide, 2 MG/DOSE, (OZEMPIC, 2 MG/DOSE,) 8 MG/3ML SOPN, Inject 2 mg into the skin every Wednesday., Disp: 15 mL, Rfl: 1  Observations/Objective: Patient is well-developed, well-nourished in no acute distress.  Resting comfortably at home.  Head is normocephalic, atraumatic.  No labored breathing. Speech is clear and coherent with logical content.  Patient is alert and oriented at baseline.   Assessment and Plan: 1. Allergic reaction, initial encounter  To unspecified trigger --  likely food additive. Benadryl OTC has helped to resolve reaction. Would have her continue for next 24 hours to make sure there is complete resolution. She is to keep symptom/food diary. Avoid use of this new sauce. Follow-up with PCP for consideration of need for allergy referral.   Follow Up Instructions: I discussed the assessment and treatment plan with the patient. The patient was provided an opportunity to ask questions and all were answered. The patient agreed with the plan and demonstrated an understanding of the instructions.  A copy of instructions were sent to the patient via MyChart unless otherwise noted below.   The patient was advised to call back or seek an in-person evaluation if the symptoms worsen or if the condition fails to improve as anticipated.  Time:  I spent 10 minutes with the patient via telehealth technology discussing the above problems/concerns.    Leeanne Rio, PA-C

## 2022-05-01 ENCOUNTER — Other Ambulatory Visit (HOSPITAL_COMMUNITY): Payer: Self-pay

## 2022-08-20 ENCOUNTER — Other Ambulatory Visit: Payer: Self-pay | Admitting: Nurse Practitioner

## 2022-08-20 DIAGNOSIS — E119 Type 2 diabetes mellitus without complications: Secondary | ICD-10-CM

## 2022-09-16 ENCOUNTER — Other Ambulatory Visit: Payer: Self-pay | Admitting: Nurse Practitioner

## 2022-09-16 DIAGNOSIS — E119 Type 2 diabetes mellitus without complications: Secondary | ICD-10-CM

## 2022-09-16 NOTE — Telephone Encounter (Signed)
Patient needs an appointment

## 2022-09-20 ENCOUNTER — Other Ambulatory Visit: Payer: Self-pay | Admitting: Nurse Practitioner

## 2022-09-20 DIAGNOSIS — E119 Type 2 diabetes mellitus without complications: Secondary | ICD-10-CM

## 2022-09-21 ENCOUNTER — Telehealth: Payer: Self-pay | Admitting: Nurse Practitioner

## 2022-09-21 NOTE — Telephone Encounter (Signed)
error 

## 2022-09-22 ENCOUNTER — Ambulatory Visit: Payer: Self-pay | Admitting: Family

## 2023-06-21 IMAGING — MG MM DIGITAL SCREENING BILAT W/ TOMO AND CAD
8 of 14 series · 8 of 40 positions shown · non-contrast
Comparison: Previous exams 02/16/2018 and earlier from [REDACTED] in [place] [HOSPITAL].

CLINICAL DATA: Screening.

EXAM:
DIGITAL SCREENING BILATERAL MAMMOGRAM WITH TOMOSYNTHESIS AND CAD
TECHNIQUE: Bilateral screening digital craniocaudal and mediolateral oblique
mammograms were obtained. Bilateral screening digital breast
tomosynthesis was performed. The images were evaluated with
computer-aided detection.

[L MLO synth-2D (1 of 2)]
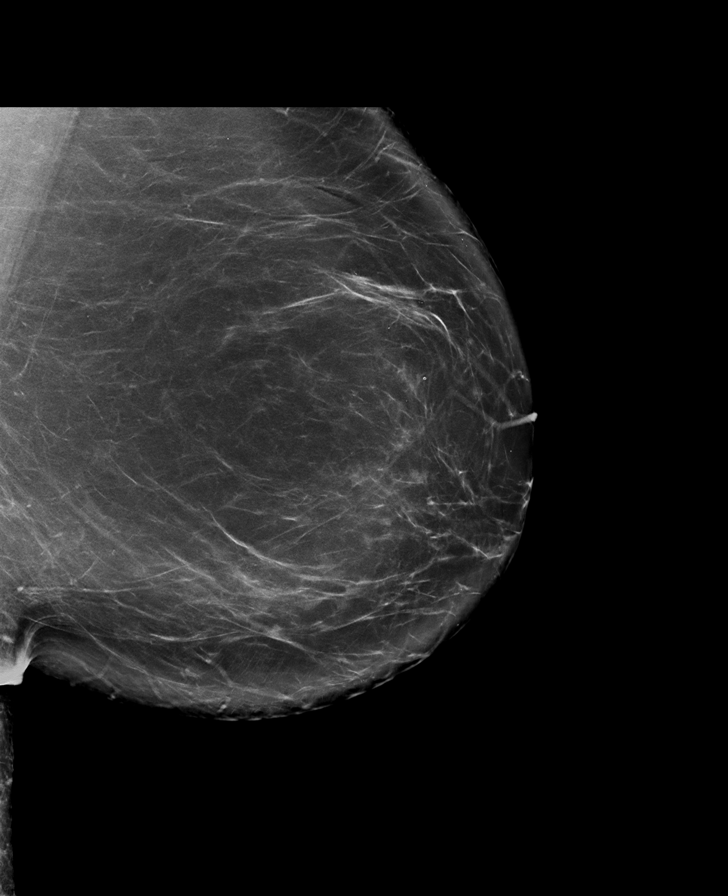

[R CC synth-2D]
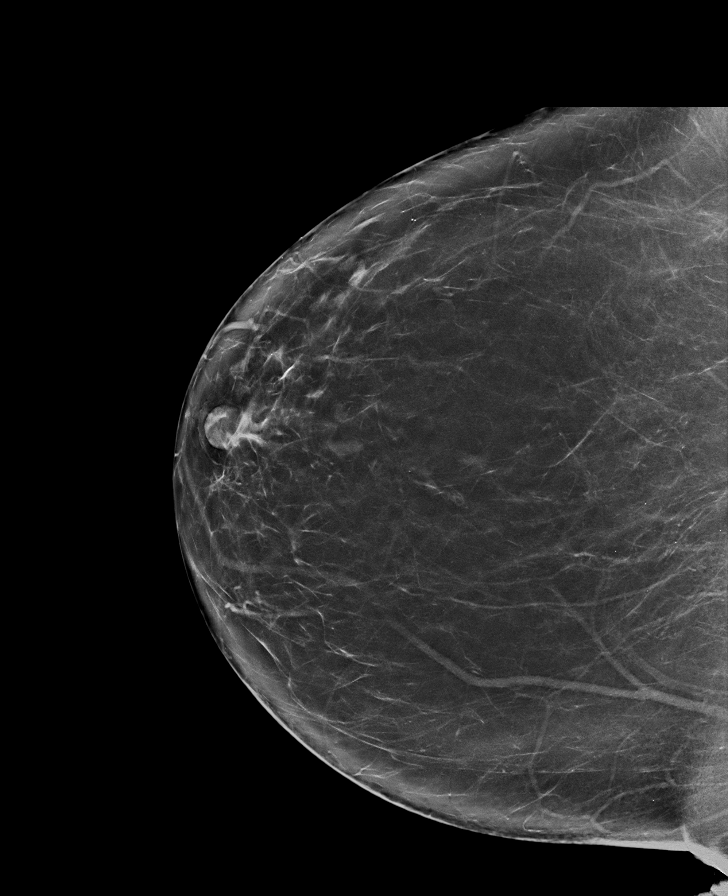

[R MLO synth-2D (1 of 2)]
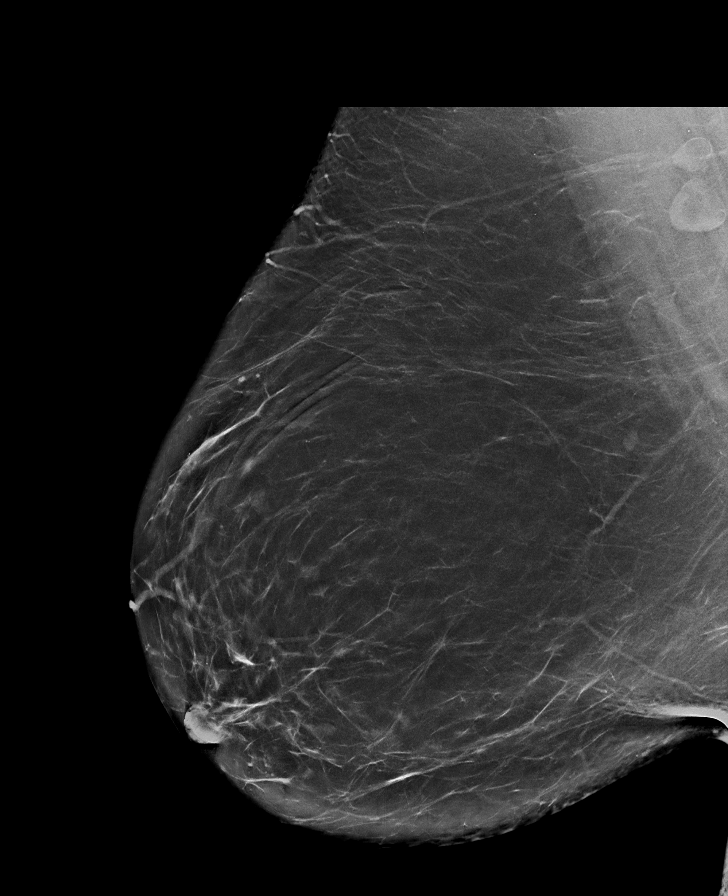

[L CC synth-2D (1 of 2)]
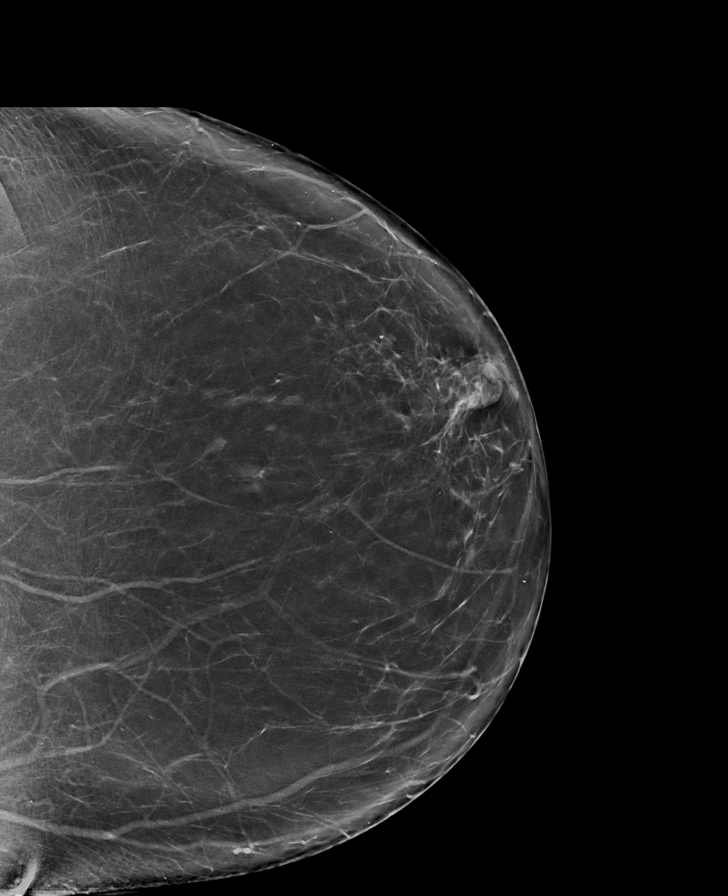

[L CC synth-2D (2 of 2)]
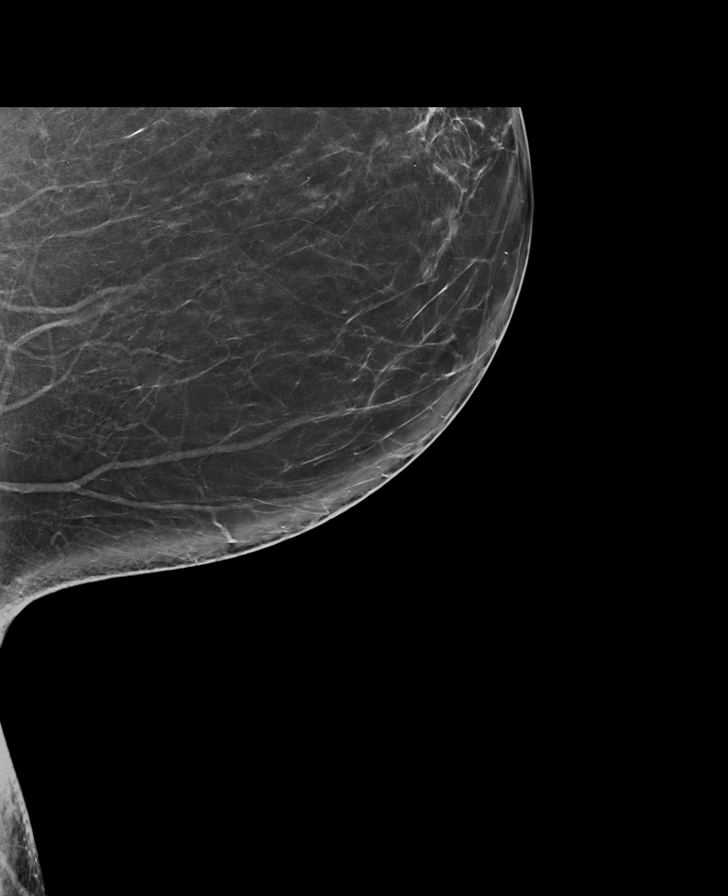

[R MLO synth-2D (2 of 2)]
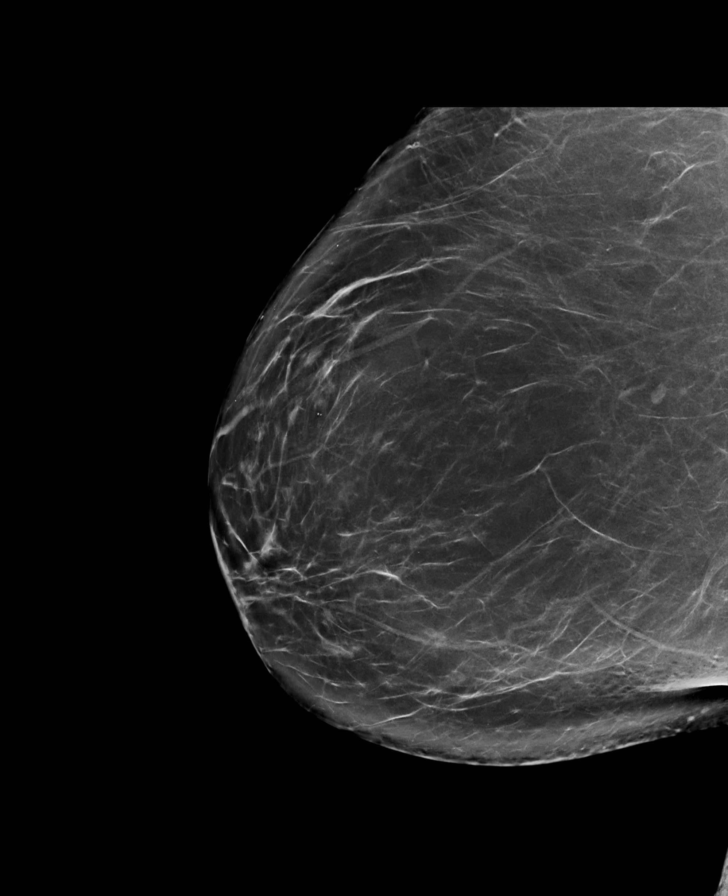

[L MLO synth-2D (2 of 2)]
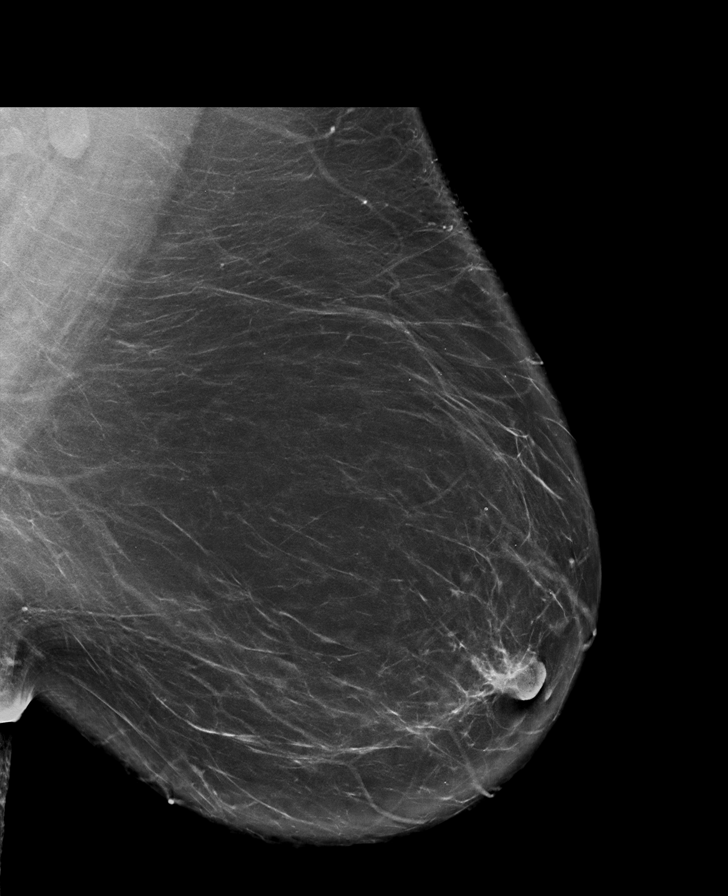

[R CC tomo · tomo slice 45/88.0]
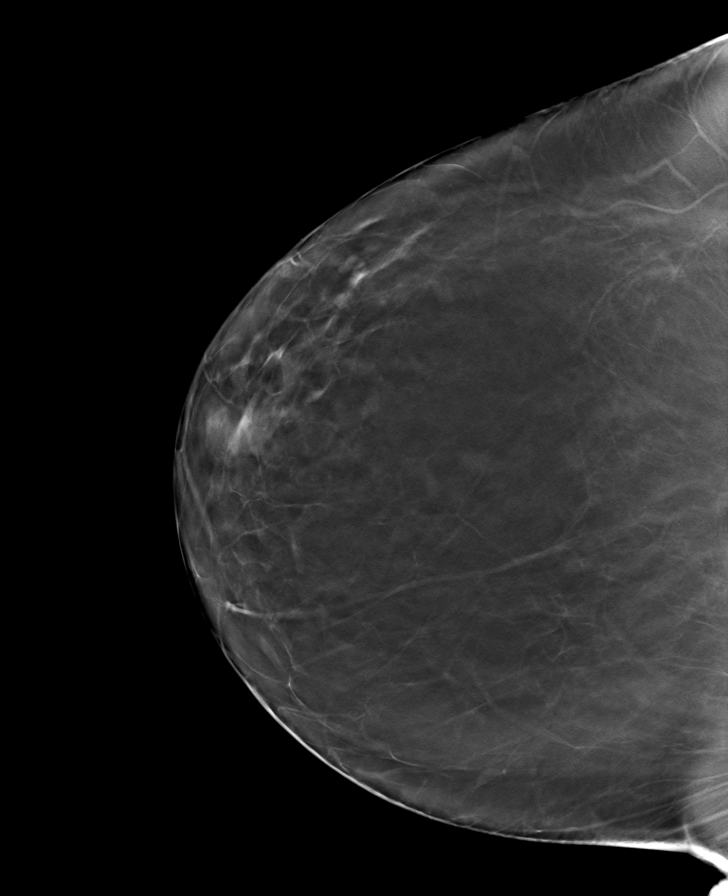

[8 of 40 positions shown; findings below may reference images not displayed]

Awaiting the prior outside
mammograms accounts for the slight delay in this report.

ACR Breast Density Category a: The breast tissue is almost entirely
fatty.
FINDINGS: There are no findings suspicious for malignancy.
IMPRESSION: No mammographic evidence of malignancy. A result letter of this
screening mammogram will be mailed directly to the patient.

RECOMMENDATION:
Screening mammogram in one year. (Code:TO-8-YY9)

BI-RADS CATEGORY  1: Negative.
